# Patient Record
Sex: Female | Born: 1985 | Race: Asian | Hispanic: No | Marital: Married | Smoking: Never smoker
Health system: Southern US, Community
[De-identification: ages and names within clinical notes are randomized; demographics above are authoritative.]

## PROBLEM LIST (undated history)

## (undated) DIAGNOSIS — A159 Respiratory tuberculosis unspecified: Secondary | ICD-10-CM

## (undated) HISTORY — PX: TUBAL LIGATION: SHX77

---

## 2017-10-11 ENCOUNTER — Other Ambulatory Visit: Payer: Self-pay | Admitting: Nurse Practitioner

## 2017-10-11 ENCOUNTER — Other Ambulatory Visit (HOSPITAL_COMMUNITY)
Admission: RE | Admit: 2017-10-11 | Discharge: 2017-10-11 | Disposition: A | Discharge status: Home / Self Care | Payer: Commercial Managed Care - PPO | Source: Ambulatory Visit | Attending: Nurse Practitioner | Admitting: Nurse Practitioner

## 2017-10-11 DIAGNOSIS — Z01419 Encounter for gynecological examination (general) (routine) without abnormal findings: Secondary | ICD-10-CM | POA: Insufficient documentation

## 2017-10-12 LAB — CYTOLOGY - PAP
Diagnosis: NEGATIVE
HPV: NOT DETECTED

## 2018-03-28 ENCOUNTER — Emergency Department (HOSPITAL_BASED_OUTPATIENT_CLINIC_OR_DEPARTMENT_OTHER)
Admission: EM | Admit: 2018-03-28 | Discharge: 2018-03-28 | Disposition: A | Discharge status: Home / Self Care | Payer: Commercial Managed Care - PPO | Attending: Emergency Medicine | Admitting: Emergency Medicine

## 2018-03-28 ENCOUNTER — Emergency Department (HOSPITAL_BASED_OUTPATIENT_CLINIC_OR_DEPARTMENT_OTHER): Payer: Commercial Managed Care - PPO

## 2018-03-28 ENCOUNTER — Encounter (HOSPITAL_BASED_OUTPATIENT_CLINIC_OR_DEPARTMENT_OTHER): Payer: Self-pay | Admitting: Emergency Medicine

## 2018-03-28 ENCOUNTER — Other Ambulatory Visit: Payer: Self-pay

## 2018-03-28 DIAGNOSIS — N838 Other noninflammatory disorders of ovary, fallopian tube and broad ligament: Secondary | ICD-10-CM

## 2018-03-28 DIAGNOSIS — R1032 Left lower quadrant pain: Secondary | ICD-10-CM | POA: Diagnosis present

## 2018-03-28 DIAGNOSIS — Z3202 Encounter for pregnancy test, result negative: Secondary | ICD-10-CM | POA: Insufficient documentation

## 2018-03-28 DIAGNOSIS — N839 Noninflammatory disorder of ovary, fallopian tube and broad ligament, unspecified: Secondary | ICD-10-CM | POA: Diagnosis not present

## 2018-03-28 LAB — COMPREHENSIVE METABOLIC PANEL
ALBUMIN: 3.9 g/dL (ref 3.5–5.0)
ALK PHOS: 66 U/L (ref 38–126)
ALT: 14 U/L (ref 0–44)
ANION GAP: 7 (ref 5–15)
AST: 18 U/L (ref 15–41)
BUN: 6 mg/dL (ref 6–20)
CALCIUM: 8.8 mg/dL — AB (ref 8.9–10.3)
CO2: 25 mmol/L (ref 22–32)
Chloride: 106 mmol/L (ref 98–111)
Creatinine, Ser: 0.47 mg/dL (ref 0.44–1.00)
GFR calc non Af Amer: >60 mL/min (ref >60)
GLUCOSE: 151 mg/dL — AB (ref 70–99)
Potassium: 3.5 mmol/L (ref 3.5–5.1)
SODIUM: 138 mmol/L (ref 135–145)
TOTAL PROTEIN: 7.2 g/dL (ref 6.5–8.1)
Total Bilirubin: 0.5 mg/dL (ref 0.3–1.2)

## 2018-03-28 LAB — PREGNANCY, URINE: Preg Test, Ur: NEGATIVE

## 2018-03-28 IMAGING — CT CT ABD-PELV W/ CM
2 of 4 series · 16 of 46 positions shown, 18 images · IV contrast (APPLIED)
Comparison: None.

CLINICAL DATA: Left lower abdominal pain 2 days.

EXAM:
CT ABDOMEN AND PELVIS WITH CONTRAST
TECHNIQUE: Multidetector CT imaging of the abdomen and pelvis was performed
using the standard protocol following bolus administration of
intravenous contrast.
CONTRAST:  100mL [Z5] IOPAMIDOL ([Z5]) INJECTION 61%

[Series 2: axial st · axial · 0.79mm/px · z∈[-472,-62]mm · 13 of 90 slices shown, 15 images]
[im 4/90  soft-tissue]
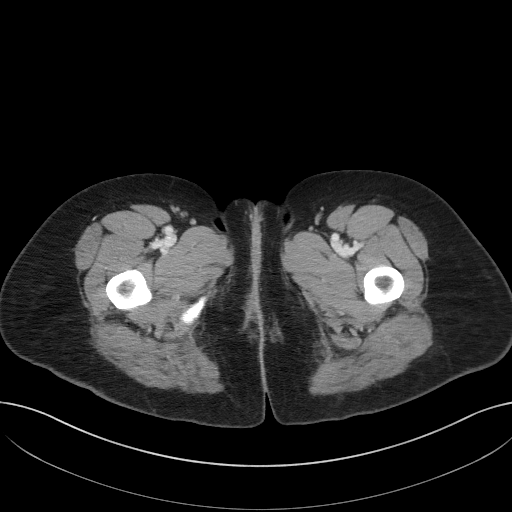
[im 4/90  bone]
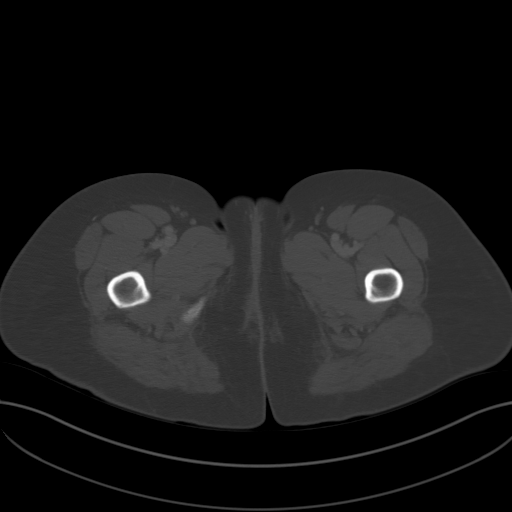
[im 12/90  soft-tissue]
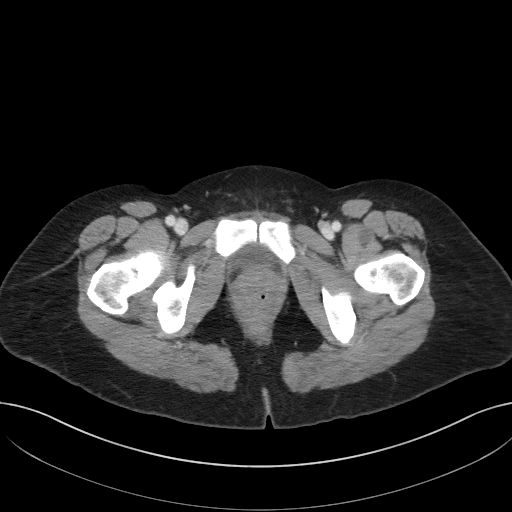
[im 19/90  soft-tissue]
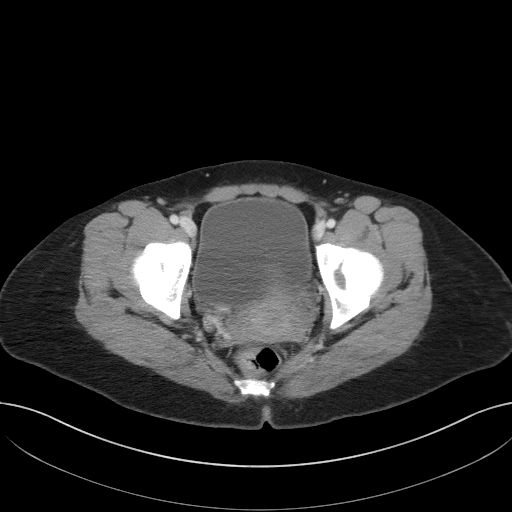
[im 26/90  soft-tissue]
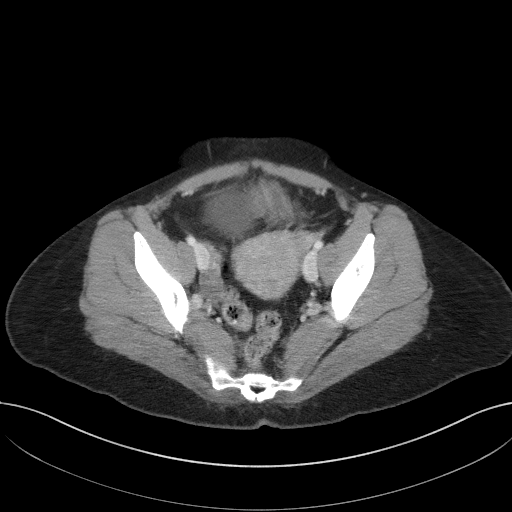
[im 30/90  soft-tissue]
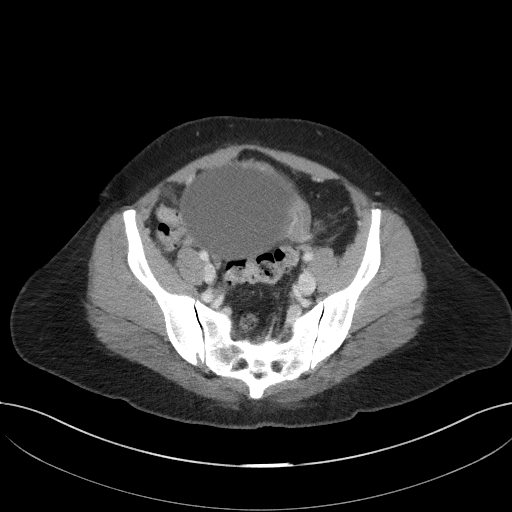
[im 38/90  soft-tissue]
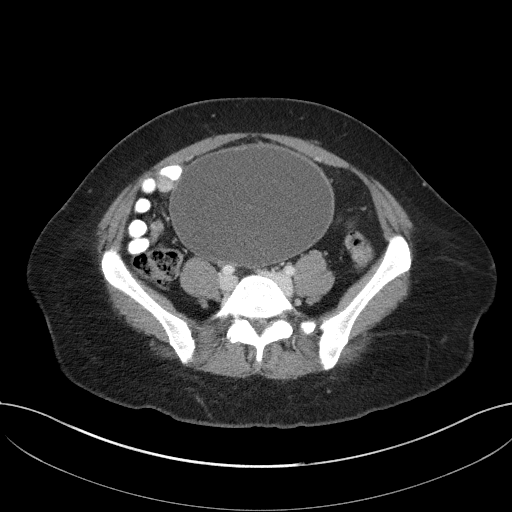
[im 45/90  soft-tissue]
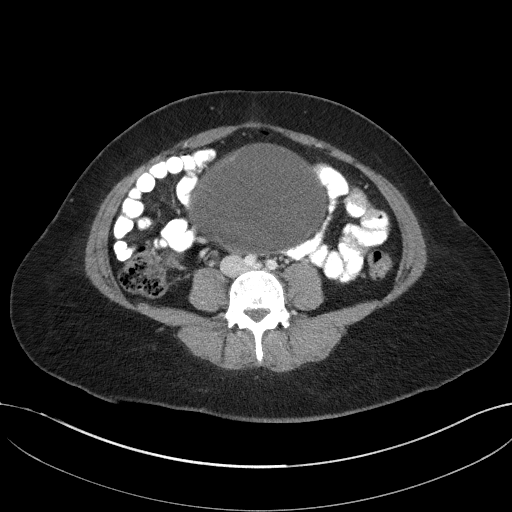
[im 52/90  soft-tissue]
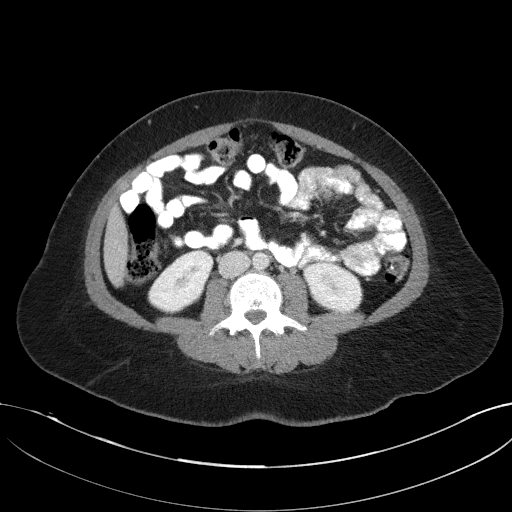
[im 60/90  soft-tissue]
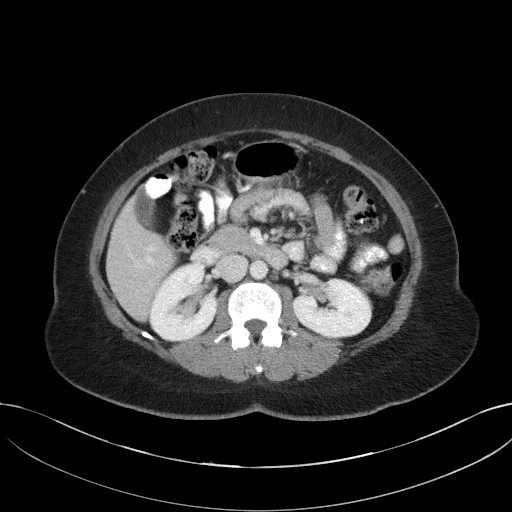
[im 60/90  bone]
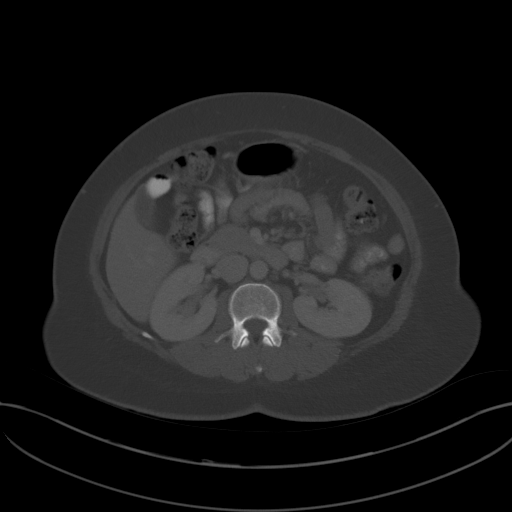
[im 64/90  soft-tissue]
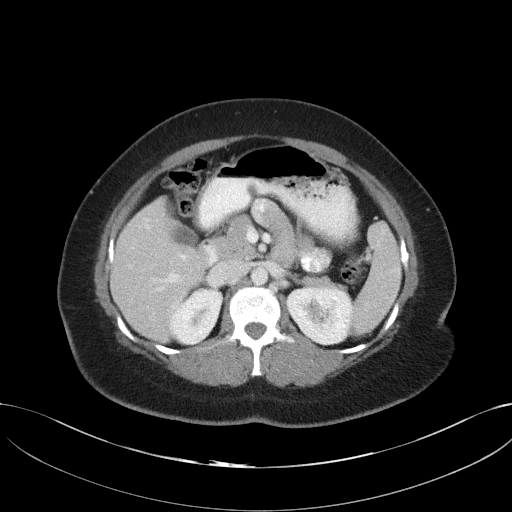
[im 71/90  soft-tissue]
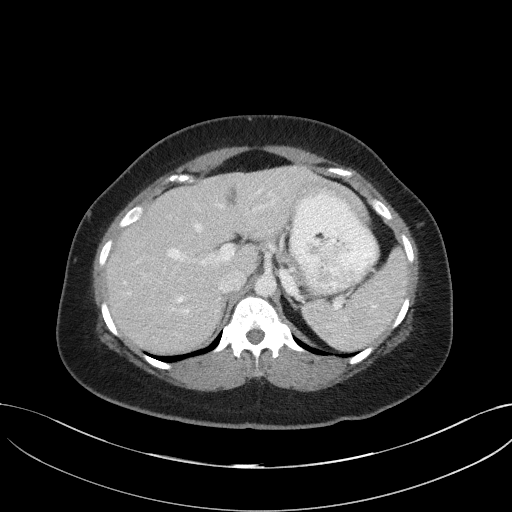
[im 78/90  soft-tissue]
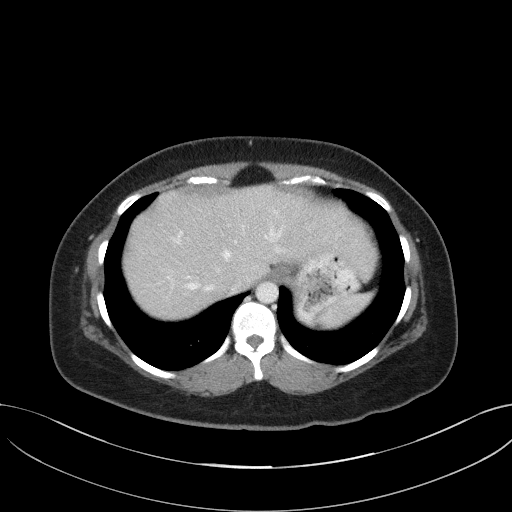
[im 86/90  soft-tissue]
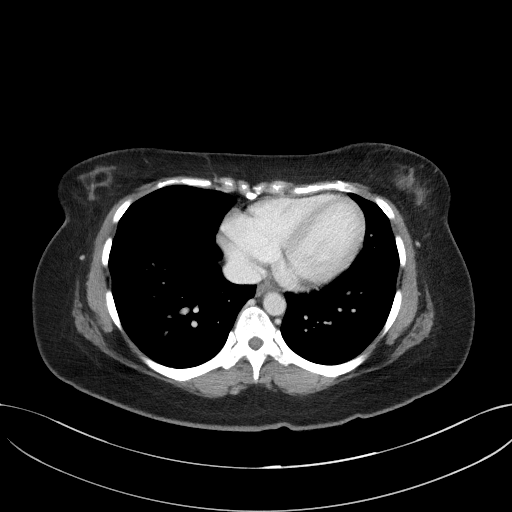

[Series 5: coronal st · coronal · 0.79mm/px · 3 of 87 slices shown]
[im 29/87  soft-tissue]
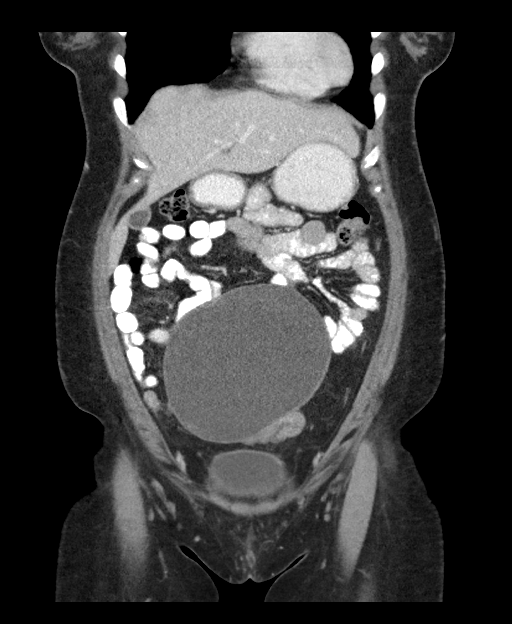
[im 39/87  soft-tissue]
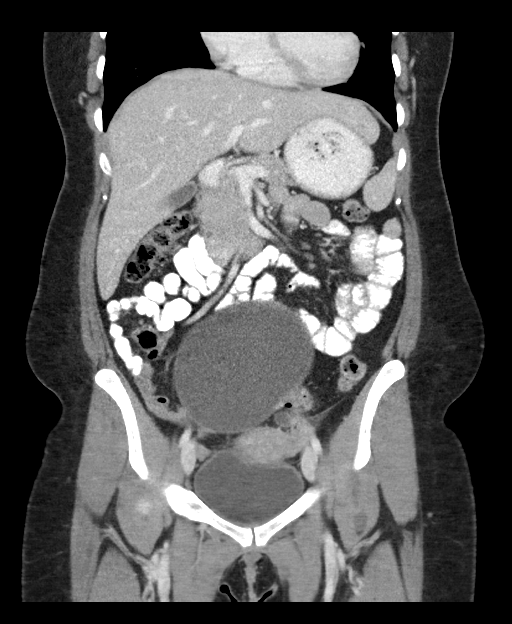
[im 48/87  soft-tissue]
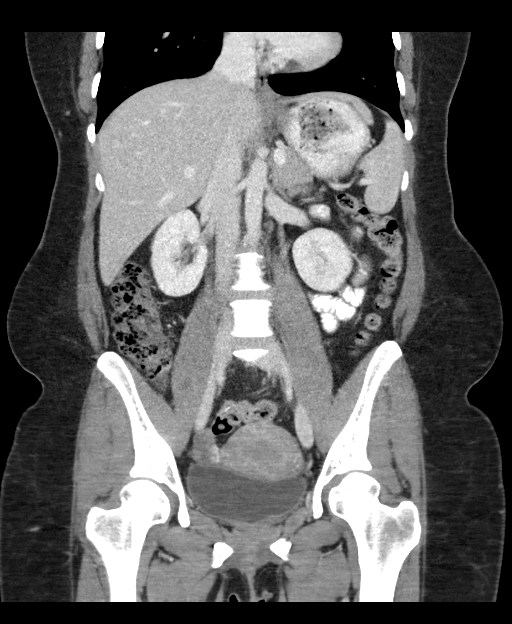

[16 of 46 positions shown; findings below may reference images not displayed]

FINDINGS: Lower chest: Normal.

Hepatobiliary: Normal.

Pancreas: Normal.

Spleen: Normal.

Adrenals/Urinary Tract: Adrenal glands are normal. Kidneys are
normal. Ureters and bladder are unremarkable.

Stomach/Bowel: Stomach and small bowel are normal. Appendix is
normal. Colon is normal.

Vascular/Lymphatic: Vascular structures are within normal. No
adenopathy.

Reproductive: Uterus and right ovary are normal. Large unilocular
cystic left ovarian mass extends superiorly into the midline lower
abdomen just below the umbilicus and measures 9.6 x 13 x 12.1 cm in
AP, transverse and craniocaudal dimensions. No significant solid
component identified.

Other: Small amount of free fluid in the pelvis.

Musculoskeletal: Within normal.
IMPRESSION: No acute findings in the abdomen/pelvis.

Large unilocular cystic left ovarian mass measuring 9.6 x 13 x
cm. This may represent a benign epithelial tumor such as a serous
cystadenoma. Consider GYN consultation possible surgical excision.
MRI may be helpful prior to surgical excision.

## 2018-03-28 MED ORDER — SODIUM CHLORIDE 0.9 % IV SOLN
INTRAVENOUS | Status: DC
  Administered 2018-03-28: 18:00:00 via INTRAVENOUS

## 2018-03-28 MED ORDER — IOPAMIDOL (ISOVUE-300) INJECTION 61%
100.0000 mL | Freq: Once | INTRAVENOUS | Status: AC | PRN
Start: 2018-03-28 — End: 2018-03-28
  Administered 2018-03-28: 100 mL via INTRAVENOUS

## 2018-03-28 MED ORDER — NAPROXEN 375 MG PO TABS: 375.0000 mg | ORAL_TABLET | Freq: Two times a day (BID) | ORAL | 0 refills | Status: DC

## 2018-03-28 NOTE — ED Triage Notes (Signed)
Patient reports left lower abdominal pain since yesterday.  Denies N/V/D, dysuria.

## 2018-03-28 NOTE — ED Provider Notes (Signed)
[facility] Provider Note   CSN: [id number] Arrival date & time: 03/28/18  1738     History   Chief Complaint Chief Complaint  Patient presents with  . Abdominal Pain    HPI Sonia Harris is a 32 y.o. female.  Patient is a 32 year old female with no significant past medical history who is presenting today with 24 hours of intermittent left lower quadrant pain.  Patient states yesterday shortly after eating some plums and drinking some water she developed a severe pain in her left lower quadrant.  She states the pain is colicky.  At its worst is 10 out of 10 but usually does not last more than 10 minutes at a time.  Standing for a prolonged period of time or doing a lot of walking or certain positions seem to make it worse.  She denies any nausea or vomiting.  Eating does not seem to make the pain worse but she was burping quite a bit yesterday.  She denies any urinary or vaginal symptoms.  Patient has had a prior tubal ligation but has been having regular periods the last one was a week and a half ago.  Patient denies fever, cough, congestion.  She has had no other surgeries to her abdomen.  She was seen at the walk-in clinic today and sent here for further care.  At the walk-in clinic she had a UA done which showed trace leukocytes but no white blood cells and a CBC done that showed a leukocytosis of 13,000 with a normal hemoglobin.  The history is provided by the patient and the spouse.    History reviewed. No pertinent past medical history.  There are no active problems to display for this patient.   History reviewed. No pertinent surgical history.   OB History   None      Home Medications    Prior to Admission medications   Not on File    Family History History reviewed. No pertinent family history.  Social History Social History   Tobacco Use  . Smoking status: Never Smoker  . Smokeless tobacco: Never Used  Substance Use Topics  .  Alcohol use: Never    Frequency: Never  . Drug use: Never     Allergies   Patient has no known allergies.   Review of Systems Review of Systems  All other systems reviewed and are negative.    Physical Exam Updated Vital Signs BP 104/74   Pulse 85   Temp 98 F (36.7 C) (Oral)   Resp 16   Ht 5\' 6"  (1.676 m)   Wt 70.3 kg (155 lb)   LMP 03/12/2018   SpO2 99%   BMI 25.02 kg/m   Physical Exam  Constitutional: She is oriented to person, place, and time. She appears well-developed and well-nourished. No distress.  HENT:  Head: Normocephalic and atraumatic.  Mouth/Throat: Oropharynx is clear and moist.  Eyes: Pupils are equal, round, and reactive to light. Conjunctivae and EOM are normal.  Neck: Normal range of motion. Neck supple.  Cardiovascular: Normal rate, regular rhythm and intact distal pulses.  No murmur heard. Pulmonary/Chest: Effort normal and breath sounds normal. No respiratory distress. She has no wheezes. She has no rales.  Abdominal: Soft. She exhibits no distension. There is tenderness in the left lower quadrant. There is guarding. There is no rebound and no CVA tenderness.  Musculoskeletal: Normal range of motion. She exhibits no edema or tenderness.  Neurological: She is alert and  oriented to person, place, and time.  Skin: Skin is warm and dry. No rash noted. No erythema.  Psychiatric: She has a normal mood and affect. Her behavior is normal.  Nursing note and vitals reviewed.    ED Treatments / Results  Labs (all labs ordered are listed, but only abnormal results are displayed) Labs Reviewed  COMPREHENSIVE METABOLIC PANEL - Abnormal; Notable for the following components:      Result Value   Glucose, Bld 151 (*)    Calcium 8.8 (*)    All other components within normal limits  PREGNANCY, URINE    EKG None  Radiology Ct Abdomen Pelvis W Contrast  Result Date: 03/28/2018 CLINICAL DATA:  Left lower abdominal pain 2 days. EXAM: CT ABDOMEN AND  PELVIS WITH CONTRAST TECHNIQUE: Multidetector CT imaging of the abdomen and pelvis was performed using the standard protocol following bolus administration of intravenous contrast. CONTRAST:  170mL ISOVUE-300 IOPAMIDOL (ISOVUE-300) INJECTION 61% COMPARISON:  None. FINDINGS: Lower chest: Normal. Hepatobiliary: Normal. Pancreas: Normal. Spleen: Normal. Adrenals/Urinary Tract: Adrenal glands are normal. Kidneys are normal. Ureters and bladder are unremarkable. Stomach/Bowel: Stomach and small bowel are normal. Appendix is normal. Colon is normal. Vascular/Lymphatic: Vascular structures are within normal. No adenopathy. Reproductive: Uterus and right ovary are normal. Large unilocular cystic left ovarian mass extends superiorly into the midline lower abdomen just below the umbilicus and measures 9.6 x 13 x 12.1 cm in AP, transverse and craniocaudal dimensions. No significant solid component identified. Other: Small amount of free fluid in the pelvis. Musculoskeletal: Within normal. IMPRESSION: No acute findings in the abdomen/pelvis. Large unilocular cystic left ovarian mass measuring 9.6 x 13 x 12.1 cm. This may represent a benign epithelial tumor such as a serous cystadenoma. Consider GYN consultation possible surgical excision. MRI may be helpful prior to surgical excision. Electronically Signed   By: Marin Olp M.D.   On: 03/28/2018 21:05    Procedures Procedures (including critical care time)  Medications Ordered in ED Medications  0.9 %  sodium chloride infusion ( Intravenous New Bag/Given 03/28/18 1816)     Initial Impression / Assessment and Plan / ED Course  I have reviewed the triage vital signs and the nursing notes.  Pertinent labs & imaging results that were available during my care of the patient were reviewed by me and considered in my medical decision making (see chart for details).    Healthy young female with no prior medical history presenting today with colicky left lower quadrant  pain that started suddenly yesterday.  This could be GI in nature and something like diverticulitis however also concern for possible kidney stone.  Lower suspicion for ovarian torsion or ectopic pregnancy.  Patient denies any urinary symptoms or symptoms concerning for urinary tract infection.  She is in a monogamous relationship with her husband and has no vaginal symptoms concerning for PID, TOA or pelvic pathology.  Currently on my exam patient's pain is minimal but she does have guarding in the left lower quadrant.  She has a known leukocytosis of 13,000 done in the walk-in clinic earlier today.  UA with out hemoglobin and only trace leukocytes.  We will do a CT to further evaluate.  Patient's urine pregnancy test is negative and CMP is pending.  9:20 PM CMP within normal limits.  Patient's CT shows a large unilocular cystic left ovarian mass measuring 9 x 13 x 12.  This may represent a benign epithelial tumor.  Discussed with Dr. Rosana Hoes our OB/GYN who at this  time recommended follow-up as an outpatient as patient is not currently uncomfortable or having pain concerning for torsion.  Final Clinical Impressions(s) / ED Diagnoses   Final diagnoses:  Ovarian mass, left    ED Discharge Orders    None       Blanchie Dessert, MD 03/28/18 2121

## 2018-03-28 NOTE — ED Notes (Signed)
Patient transported to CT 

## 2018-03-28 NOTE — ED Notes (Signed)
Pt given oral contrast.

## 2018-03-28 NOTE — ED Notes (Signed)
Patient completed both bottles of PO contrast; denies pain or NV; nad noted.

## 2018-03-28 NOTE — Discharge Instructions (Signed)
You need to return to [facility] hospital for severe pain that does not go away.  Call your doctor in the morning for appointment in the next few days.  Tell them you were seen the ER and found an ovarian mass that needs further workup.

## 2018-04-09 ENCOUNTER — Encounter (HOSPITAL_COMMUNITY): Payer: Self-pay | Admitting: *Deleted

## 2018-04-10 ENCOUNTER — Encounter (HOSPITAL_COMMUNITY): Payer: Self-pay | Admitting: Certified Registered Nurse Anesthetist

## 2018-04-10 ENCOUNTER — Other Ambulatory Visit: Payer: Self-pay

## 2018-04-10 ENCOUNTER — Ambulatory Visit (HOSPITAL_COMMUNITY): Payer: Commercial Managed Care - PPO | Admitting: Certified Registered Nurse Anesthetist

## 2018-04-10 ENCOUNTER — Ambulatory Visit (HOSPITAL_COMMUNITY)
Admission: RE | Admit: 2018-04-10 | Discharge: 2018-04-10 | Disposition: A | Discharge status: Home / Self Care | Payer: Commercial Managed Care - PPO | Source: Ambulatory Visit | Attending: Obstetrics and Gynecology | Admitting: Obstetrics and Gynecology

## 2018-04-10 ENCOUNTER — Encounter (HOSPITAL_COMMUNITY)
Admission: RE | Disposition: A | Discharge status: Home / Self Care | Payer: Self-pay | Source: Ambulatory Visit | Attending: Obstetrics and Gynecology

## 2018-04-10 DIAGNOSIS — Z8349 Family history of other endocrine, nutritional and metabolic diseases: Secondary | ICD-10-CM | POA: Diagnosis not present

## 2018-04-10 DIAGNOSIS — K660 Peritoneal adhesions (postprocedural) (postinfection): Secondary | ICD-10-CM | POA: Diagnosis not present

## 2018-04-10 DIAGNOSIS — D271 Benign neoplasm of left ovary: Secondary | ICD-10-CM | POA: Diagnosis not present

## 2018-04-10 DIAGNOSIS — Z833 Family history of diabetes mellitus: Secondary | ICD-10-CM | POA: Diagnosis not present

## 2018-04-10 DIAGNOSIS — Z8249 Family history of ischemic heart disease and other diseases of the circulatory system: Secondary | ICD-10-CM | POA: Insufficient documentation

## 2018-04-10 DIAGNOSIS — N83202 Unspecified ovarian cyst, left side: Secondary | ICD-10-CM | POA: Diagnosis present

## 2018-04-10 HISTORY — PX: LAPAROSCOPIC LYSIS OF ADHESIONS: SHX5905

## 2018-04-10 HISTORY — DX: Respiratory tuberculosis unspecified: A15.9

## 2018-04-10 LAB — TYPE AND SCREEN
ABO/RH(D): O POS
Antibody Screen: NEGATIVE

## 2018-04-10 LAB — CBC
HCT: 37.4 % (ref 36.0–46.0)
Hemoglobin: 12.3 g/dL (ref 12.0–15.0)
MCH: 27.7 pg (ref 26.0–34.0)
MCHC: 32.9 g/dL (ref 30.0–36.0)
MCV: 84.2 fL (ref 78.0–100.0)
PLATELETS: 280 10*3/uL (ref 150–400)
RBC: 4.44 MIL/uL (ref 3.87–5.11)
RDW: 13.6 % (ref 11.5–15.5)
WBC: 8.8 10*3/uL (ref 4.0–10.5)

## 2018-04-10 LAB — PREGNANCY, URINE: Preg Test, Ur: NEGATIVE

## 2018-04-10 LAB — ABO/RH: ABO/RH(D): O POS

## 2018-04-10 SURGERY — OOPHORECTOMY, LAPAROSCOPIC
Anesthesia: General | Laterality: Left

## 2018-04-10 MED ORDER — LIDOCAINE HCL (PF) 1 % IJ SOLN
INTRAMUSCULAR | Status: AC
  Filled 2018-04-10: qty 5

## 2018-04-10 MED ORDER — SCOPOLAMINE 1 MG/3DAYS TD PT72
1.0000 | MEDICATED_PATCH | Freq: Once | TRANSDERMAL | Status: DC
  Administered 2018-04-10: 1.5 mg via TRANSDERMAL

## 2018-04-10 MED ORDER — FENTANYL CITRATE (PF) 100 MCG/2ML IJ SOLN
INTRAMUSCULAR | Status: DC
Start: 2018-04-10 — End: 2018-04-10
  Filled 2018-04-10: qty 2

## 2018-04-10 MED ORDER — KETOROLAC TROMETHAMINE 30 MG/ML IJ SOLN
INTRAMUSCULAR | Status: DC | PRN
  Administered 2018-04-10: 30 mg via INTRAVENOUS

## 2018-04-10 MED ORDER — DEXAMETHASONE SODIUM PHOSPHATE 10 MG/ML IJ SOLN
INTRAMUSCULAR | Status: DC | PRN
  Administered 2018-04-10: 4 mg via INTRAVENOUS

## 2018-04-10 MED ORDER — PROPOFOL 10 MG/ML IV BOLUS
INTRAVENOUS | Status: DC | PRN
  Administered 2018-04-10: 150 mg via INTRAVENOUS

## 2018-04-10 MED ORDER — CEFAZOLIN SODIUM-DEXTROSE 2-4 GM/100ML-% IV SOLN
2.0000 g | INTRAVENOUS | Status: AC
  Administered 2018-04-10: 2 g via INTRAVENOUS

## 2018-04-10 MED ORDER — LIDOCAINE HCL (CARDIAC) PF 100 MG/5ML IV SOSY
PREFILLED_SYRINGE | INTRAVENOUS | Status: DC | PRN
  Administered 2018-04-10: 50 mg via INTRAVENOUS

## 2018-04-10 MED ORDER — EPHEDRINE 5 MG/ML INJ
INTRAVENOUS | Status: AC
  Filled 2018-04-10: qty 10

## 2018-04-10 MED ORDER — PROMETHAZINE HCL 25 MG/ML IJ SOLN: 6.2500 mg | INTRAMUSCULAR | Status: DC | PRN

## 2018-04-10 MED ORDER — FENTANYL CITRATE (PF) 250 MCG/5ML IJ SOLN
INTRAMUSCULAR | Status: AC
  Filled 2018-04-10: qty 5

## 2018-04-10 MED ORDER — FENTANYL CITRATE (PF) 100 MCG/2ML IJ SOLN
INTRAMUSCULAR | Status: DC | PRN
  Administered 2018-04-10 (×4): 50 ug via INTRAVENOUS

## 2018-04-10 MED ORDER — MIDAZOLAM HCL 2 MG/2ML IJ SOLN
INTRAMUSCULAR | Status: DC | PRN
  Administered 2018-04-10 (×2): 1 mg via INTRAVENOUS

## 2018-04-10 MED ORDER — FENTANYL CITRATE (PF) 100 MCG/2ML IJ SOLN
25.0000 ug | INTRAMUSCULAR | Status: DC | PRN
  Administered 2018-04-10: 50 ug via INTRAVENOUS

## 2018-04-10 MED ORDER — PROPOFOL 10 MG/ML IV BOLUS
INTRAVENOUS | Status: AC
  Filled 2018-04-10: qty 20

## 2018-04-10 MED ORDER — ONDANSETRON HCL 4 MG/2ML IJ SOLN
INTRAMUSCULAR | Status: AC
  Filled 2018-04-10: qty 2

## 2018-04-10 MED ORDER — SUGAMMADEX SODIUM 200 MG/2ML IV SOLN
INTRAVENOUS | Status: AC
  Filled 2018-04-10: qty 2

## 2018-04-10 MED ORDER — IBUPROFEN 600 MG PO TABS: 600.0000 mg | ORAL_TABLET | Freq: Four times a day (QID) | ORAL | 1 refills | Status: AC | PRN

## 2018-04-10 MED ORDER — ROCURONIUM BROMIDE 100 MG/10ML IV SOLN
INTRAVENOUS | Status: DC | PRN
  Administered 2018-04-10: 10 mg via INTRAVENOUS
  Administered 2018-04-10: 40 mg via INTRAVENOUS

## 2018-04-10 MED ORDER — SUGAMMADEX SODIUM 200 MG/2ML IV SOLN
INTRAVENOUS | Status: DC | PRN
  Administered 2018-04-10: 150 mg via INTRAVENOUS

## 2018-04-10 MED ORDER — BUPIVACAINE HCL (PF) 0.25 % IJ SOLN
INTRAMUSCULAR | Status: DC | PRN
  Administered 2018-04-10: 30 mL

## 2018-04-10 MED ORDER — ONDANSETRON HCL 4 MG/2ML IJ SOLN
INTRAMUSCULAR | Status: DC | PRN
  Administered 2018-04-10: 4 mg via INTRAVENOUS

## 2018-04-10 MED ORDER — EPHEDRINE SULFATE 50 MG/ML IJ SOLN
INTRAMUSCULAR | Status: DC | PRN
  Administered 2018-04-10: 10 mg via INTRAVENOUS

## 2018-04-10 MED ORDER — KETOROLAC TROMETHAMINE 30 MG/ML IJ SOLN
INTRAMUSCULAR | Status: AC
  Filled 2018-04-10: qty 1

## 2018-04-10 MED ORDER — DEXAMETHASONE SODIUM PHOSPHATE 4 MG/ML IJ SOLN
INTRAMUSCULAR | Status: AC
  Filled 2018-04-10: qty 1

## 2018-04-10 MED ORDER — OXYCODONE-ACETAMINOPHEN 5-325 MG PO TABS: 1.0000 | ORAL_TABLET | ORAL | 0 refills | Status: AC | PRN

## 2018-04-10 MED ORDER — SCOPOLAMINE 1 MG/3DAYS TD PT72
MEDICATED_PATCH | TRANSDERMAL | Status: AC
  Filled 2018-04-10: qty 1

## 2018-04-10 MED ORDER — CEFAZOLIN SODIUM-DEXTROSE 2-4 GM/100ML-% IV SOLN
INTRAVENOUS | Status: AC
  Filled 2018-04-10: qty 100

## 2018-04-10 MED ORDER — LACTATED RINGERS IV SOLN
INTRAVENOUS | Status: DC
  Administered 2018-04-10: 14:00:00 via INTRAVENOUS
  Administered 2018-04-10: 1000 mL via INTRAVENOUS

## 2018-04-10 MED ORDER — MIDAZOLAM HCL 2 MG/2ML IJ SOLN
INTRAMUSCULAR | Status: AC
  Filled 2018-04-10: qty 2

## 2018-04-10 SURGICAL SUPPLY — 33 items
APPLICATOR ARISTA FLEXITIP XL (MISCELLANEOUS) ×4 IMPLANT
DERMABOND ADHESIVE PROPEN (GAUZE/BANDAGES/DRESSINGS) ×2
DERMABOND ADVANCED (GAUZE/BANDAGES/DRESSINGS) ×2
DERMABOND ADVANCED .7 DNX12 (GAUZE/BANDAGES/DRESSINGS) ×2 IMPLANT
DERMABOND ADVANCED .7 DNX6 (GAUZE/BANDAGES/DRESSINGS) ×2 IMPLANT
DILATOR CANAL MILEX (MISCELLANEOUS) ×4 IMPLANT
DRSG OPSITE POSTOP 3X4 (GAUZE/BANDAGES/DRESSINGS) ×4 IMPLANT
DURAPREP 26ML APPLICATOR (WOUND CARE) ×4 IMPLANT
FILTER SMOKE EVAC LAPAROSHD (FILTER) ×4 IMPLANT
GLOVE BIO SURGEON STRL SZ7 (GLOVE) ×4 IMPLANT
GLOVE BIOGEL PI IND STRL 7.0 (GLOVE) ×8 IMPLANT
GLOVE BIOGEL PI INDICATOR 7.0 (GLOVE) ×8
GOWN STRL REUS W/TWL LRG LVL3 (GOWN DISPOSABLE) ×8 IMPLANT
HEMOSTAT ARISTA ABSORB 3G PWDR (MISCELLANEOUS) ×4 IMPLANT
NEEDLE SPNL 18GX3.5 QUINCKE PK (NEEDLE) ×4 IMPLANT
PACK LAPAROSCOPY BASIN (CUSTOM PROCEDURE TRAY) ×4 IMPLANT
PACK TRENDGUARD 450 HYBRID PRO (MISCELLANEOUS) ×2 IMPLANT
POUCH ENDO CATCH II 15MM (MISCELLANEOUS) ×4 IMPLANT
POUCH SPECIMEN RETRIEVAL 10MM (ENDOMECHANICALS) IMPLANT
PROTECTOR NERVE ULNAR (MISCELLANEOUS) ×8 IMPLANT
SET IRRIG TUBING LAPAROSCOPIC (IRRIGATION / IRRIGATOR) ×4 IMPLANT
SHEARS HARMONIC ACE PLUS 36CM (ENDOMECHANICALS) ×4 IMPLANT
SLEEVE XCEL OPT CAN 5 100 (ENDOMECHANICALS) ×4 IMPLANT
SUT MNCRL AB 4-0 PS2 18 (SUTURE) ×4 IMPLANT
SUT VICRYL 0 UR6 27IN ABS (SUTURE) ×4 IMPLANT
SYRINGE 60CC LL (MISCELLANEOUS) ×4 IMPLANT
TOWEL OR 17X24 6PK STRL BLUE (TOWEL DISPOSABLE) ×8 IMPLANT
TRAY FOLEY W/BAG SLVR 14FR (SET/KITS/TRAYS/PACK) ×4 IMPLANT
TRENDGUARD 450 HYBRID PRO PACK (MISCELLANEOUS) ×4
TROCAR BLADELESS 15MM (ENDOMECHANICALS) ×4 IMPLANT
TROCAR XCEL NON-BLD 11X100MML (ENDOMECHANICALS) ×4 IMPLANT
TROCAR XCEL NON-BLD 5MMX100MML (ENDOMECHANICALS) ×4 IMPLANT
TUBING INSUF HEATED (TUBING) ×4 IMPLANT

## 2018-04-10 NOTE — Transfer of Care (Signed)
Immediate Anesthesia Transfer of Care Note  Patient: Sonia Harris  Procedure(s) Performed: LAPAROSCOPIC OOPHORECTOMY (Left ) LAPAROSCOPIC LYSIS OF ADHESIONS  Patient Location: PACU  Anesthesia Type:General  Level of Consciousness: awake, alert  and oriented  Airway & Oxygen Therapy: Patient Spontanous Breathing and Patient connected to nasal cannula oxygen  Post-op Assessment: Report given to RN and Post -op Vital signs reviewed and stable  Post vital signs: Reviewed and stable  Last Vitals:  Vitals Value Taken Time  BP 107/53 04/10/2018  2:57 PM  Temp    Pulse 81 04/10/2018  2:58 PM  Resp 13 04/10/2018  2:58 PM  SpO2 94 % 04/10/2018  2:58 PM  Vitals shown include unvalidated device data.  Last Pain:  Vitals:   04/10/18 1228  TempSrc: Oral      Patients Stated Pain Goal: 3 (08/03/10 1735)  Complications: No apparent anesthesia complications

## 2018-04-10 NOTE — Interval H&P Note (Signed)
History and Physical Interval Note:  04/10/2018 12:46 PM  Sonia Harris  has presented today for surgery, with the diagnosis of N83.9 Ovarian mass  The various methods of treatment have been discussed with the patient and family. After consideration of risks, benefits and other options for treatment, the patient has consented to  Procedure(s) with comments: LAPAROSCOPIC OOPHORECTOMY (Left) - pull the 15 trocar and the 15 bag as a surgical intervention .  The patient's history has been reviewed, patient examined, no change in status, stable for surgery.  I have reviewed the patient's chart and labs.  Questions were answered to the patient's satisfaction.     [name]

## 2018-04-10 NOTE — Anesthesia Procedure Notes (Signed)
Procedure Name: Intubation Date/Time: 04/10/2018 1:13 PM Performed by: Bufford Spikes, CRNA Pre-anesthesia Checklist: Patient identified, Emergency Drugs available, Suction available and Patient being monitored Patient Re-evaluated:Patient Re-evaluated prior to induction Oxygen Delivery Method: Circle system utilized Preoxygenation: Pre-oxygenation with 100% oxygen Induction Type: IV induction Ventilation: Mask ventilation without difficulty Laryngoscope Size: Miller and 2 Grade View: Grade I Tube type: Oral Tube size: 7.0 mm Number of attempts: 1 Airway Equipment and Method: Stylet and Oral airway Placement Confirmation: ETT inserted through vocal cords under direct vision,  positive ETCO2 and breath sounds checked- equal and bilateral Secured at: 20 cm Tube secured with: Tape Dental Injury: Teeth and Oropharynx as per pre-operative assessment

## 2018-04-10 NOTE — Anesthesia Preprocedure Evaluation (Signed)
Anesthesia Evaluation  Patient identified by MRN, date of birth, ID band Patient awake    Reviewed: Allergy & Precautions, NPO status , Patient's Chart, lab work & pertinent test results  Airway Mallampati: II  TM Distance: >3 FB Neck ROM: Full    Dental  (+) Dental Advisory Given   Pulmonary neg pulmonary ROS,    breath sounds clear to auscultation       Cardiovascular negative cardio ROS   Rhythm:Regular Rate:Normal     Neuro/Psych negative neurological ROS     GI/Hepatic negative GI ROS, Neg liver ROS,   Endo/Other  negative endocrine ROS  Renal/GU negative Renal ROS     Musculoskeletal   Abdominal   Peds  Hematology negative hematology ROS (+)   Anesthesia Other Findings   Reproductive/Obstetrics                            No results found for: WBC, HGB, HCT, MCV, PLT Lab Results  Component Value Date   CREATININE 0.47 03/28/2018   BUN 6 03/28/2018   NA 138 03/28/2018   K 3.5 03/28/2018   CL 106 03/28/2018   CO2 25 03/28/2018    Anesthesia Physical Anesthesia Plan  ASA: I  Anesthesia Plan: General   Post-op Pain Management:    Induction: Intravenous  PONV Risk Score and Plan: 4 or greater and Scopolamine patch - Pre-op, Midazolam, Dexamethasone, Ondansetron and Treatment may vary due to age or medical condition  Airway Management Planned: Oral ETT  Additional Equipment:   Intra-op Plan:   Post-operative Plan: Extubation in OR  Informed Consent: I have reviewed the patients History and Physical, chart, labs and discussed the procedure including the risks, benefits and alternatives for the proposed anesthesia with the patient or authorized representative who has indicated his/her understanding and acceptance.   Dental advisory given  Plan Discussed with: CRNA  Anesthesia Plan Comments:         Anesthesia Quick Evaluation

## 2018-04-10 NOTE — Discharge Instructions (Addendum)
DISCHARGE INSTRUCTIONS: Laparoscopy ° °The following instructions have been prepared to help you care for yourself upon your return home today. ° °Wound care: °• Do not get the incision wet for the first 24 hours. The incision should be kept clean and dry. °• The Band-Aids or dressings may be removed the day after surgery. °• Should the incision become sore, red, and swollen after the first week, check with your doctor. ° °Personal hygiene: °• Shower the day after your procedure. ° °Activity and limitations: °• Do NOT drive or operate any equipment today. °• Do NOT lift anything more than 15 pounds for 2-3 weeks after surgery. °• Do NOT rest in bed all day. °• Walking is encouraged. Walk each day, starting slowly with 5-minute walks 3 or 4 times a day. Slowly increase the length of your walks. °• Walk up and down stairs slowly. °• Do NOT do strenuous activities, such as golfing, playing tennis, bowling, running, biking, weight lifting, gardening, mowing, or vacuuming for 2-4 weeks. Ask your doctor when it is okay to start. ° °Diet: Eat a light meal as desired this evening. You may resume your usual diet tomorrow. ° °Return to work: This is dependent on the type of work you do. For the most part you can return to a desk job within a week of surgery. If you are more active at work, please discuss this with your doctor. ° °What to expect after your surgery: You may have a slight burning sensation when you urinate on the first day. You may have a very small amount of blood in the urine. Expect to have a small amount of vaginal discharge/light bleeding for 1-2 weeks. It is not unusual to have abdominal soreness and bruising for up to 2 weeks. You may be tired and need more rest for about 1 week. You may experience shoulder pain for 24-72 hours. Lying flat in bed may relieve it. ° °Call your doctor for any of the following: °• Develop a fever of 100.4 or greater °• Inability to urinate 6 hours after discharge from  hospital °• Severe pain not relieved by pain medications °• Persistent of heavy bleeding at incision site °• Redness or swelling around incision site after a week °• Increasing nausea or vomiting ° °Patient Signature________________________________________ °Nurse Signature_________________________________________DISCHARGE INSTRUCTIONS: Laparoscopy ° °The following instructions have been prepared to help you care for yourself upon your return home today. ° °Wound care: °• Do not get the incision wet for the first 24 hours. The incision should be kept clean and dry. °• The Band-Aids or dressings may be removed the day after surgery. °• Should the incision become sore, red, and swollen after the first week, check with your doctor. ° °Personal hygiene: °• Shower the day after your procedure. ° °Activity and limitations: °• Do NOT drive or operate any equipment today. °• Do NOT lift anything more than 15 pounds for 2-3 weeks after surgery. °• Do NOT rest in bed all day. °• Walking is encouraged. Walk each day, starting slowly with 5-minute walks 3 or 4 times a day. Slowly increase the length of your walks. °• Walk up and down stairs slowly. °• Do NOT do strenuous activities, such as golfing, playing tennis, bowling, running, biking, weight lifting, gardening, mowing, or vacuuming for 2-4 weeks. Ask your doctor when it is okay to start. ° °Diet: Eat a light meal as desired this evening. You may resume your usual diet tomorrow. ° °Return to work: This is dependent on   the type of work you do. For the most part you can return to a desk job within a week of surgery. If you are more active at work, please discuss this with your doctor.  What to expect after your surgery: You may have a slight burning sensation when you urinate on the first day. You may have a very small amount of blood in the urine. Expect to have a small amount of vaginal discharge/light bleeding for 1-2 weeks. It is not unusual to have abdominal soreness  and bruising for up to 2 weeks. You may be tired and need more rest for about 1 week. You may experience shoulder pain for 24-72 hours. Lying flat in bed may relieve it.  Call your doctor for any of the following:  Develop a fever of 100.4 or greater  Inability to urinate 6 hours after discharge from hospital  Severe pain not relieved by pain medications  Persistent of heavy bleeding at incision site  Redness or swelling around incision site after a week  Increasing nausea or vomiting  Patient Signature________________________________________ Nurse Signature_________________________________________ Post Anesthesia Home Care Instructions  Activity: Get plenty of rest for the remainder of the day. A responsible individual must stay with you for 24 hours following the procedure.  For the next 24 hours, DO NOT: -Drive a car -Paediatric nurse -Drink alcoholic beverages -Take any medication unless instructed by your physician -Make any legal decisions or sign important papers.  Meals: Start with liquid foods such as gelatin or soup. Progress to regular foods as tolerated. Avoid greasy, spicy, heavy foods. If nausea and/or vomiting occur, drink only clear liquids until the nausea and/or vomiting subsides. Call your physician if vomiting continues.  Special Instructions/Symptoms: Your throat may feel dry or sore from the anesthesia or the breathing tube placed in your throat during surgery. If this causes discomfort, gargle with warm salt water. The discomfort should disappear within 24 hours.  If you had a scopolamine patch placed behind your ear for the management of post- operative nausea and/or vomiting:  1. The medication in the patch is effective for 72 hours, after which it should be removed.  Wrap patch in a tissue and discard in the trash. Wash hands thoroughly with soap and water. 2. You may remove the patch earlier than 72 hours if you experience unpleasant side effects  which may include dry mouth, dizziness or visual disturbances. 3. Avoid touching the patch. Wash your hands with soap and water after contact with the patch.   Diagnostic Laparoscopy, Care After Refer to this sheet in the next few weeks. These instructions provide you with information about caring for yourself after your procedure. Your health care provider may also give you more specific instructions. Your treatment has been planned according to current medical practices, but problems sometimes occur. Call your health care provider if you have any problems or questions after your procedure. What can I expect after the procedure? After your procedure, it is common to have mild discomfort in the throat and abdomen. Follow these instructions at home:  Take over-the-counter and prescription medicines only as told by your health care provider.  Do not drive for 24 hours if you received a sedative.  Return to your normal activities as told by your health care provider.  Do not take baths, swim, or use a hot tub until your health care provider approves. You may shower.  Follow instructions from your health care provider about how to take care of your incision.  Make sure you: ? Wash your hands with soap and water before you change your bandage (dressing). If soap and water are not available, use hand sanitizer. ? Change your dressing as told by your health care provider. ? Leave stitches (sutures), skin glue, or adhesive strips in place. These skin closures may need to stay in place for 2 weeks or longer. If adhesive strip edges start to loosen and curl up, you may trim the loose edges. Do not remove adhesive strips completely unless your health care provider tells you to do that.  Check your incision area every day for signs of infection. Check for: ? More redness, swelling, or pain. ? More fluid or blood. ? Warmth. ? Pus or a bad smell.  It is your responsibility to get the results of your  procedure. Ask your health care provider or the department performing the procedure when your results will be ready. Contact a health care provider if:  There is new pain in your shoulders.  You feel light-headed or faint.  You are unable to pass gas or unable to have a bowel movement.  You feel nauseous or you vomit.  You develop a rash.  You have more redness, swelling, or pain around your incision.  You have more fluid or blood coming from your incision.  Your incision feels warm to the touch.  You have pus or a bad smell coming from your incision.  You have a fever or chills. Get help right away if:  Your pain is getting worse.  You have ongoing vomiting.  The edges of your incision open up.  You have trouble breathing.  You have chest pain. This information is not intended to replace advice given to you by your health care provider. Make sure you discuss any questions you have with your health care provider. Document Released: 08/30/2015 Document Revised: 02/24/2016 Document Reviewed: 06/01/2015 Elsevier Interactive Patient Education  2018 [employer].

## 2018-04-10 NOTE — Brief Op Note (Signed)
04/10/2018  2:49 PM  PATIENT:  Sonia Harris  32 y.o. female  PRE-OPERATIVE DIAGNOSIS:  N83.9 Ovarian mass, left  POST-OPERATIVE DIAGNOSIS:  Same, omental adhesions  PROCEDURE:  Procedure(s): LAPAROSCOPIC OOPHORECTOMY (Left) LAPAROSCOPIC LYSIS OF ADHESIONS  SURGEON:  Surgeon(s) and Role:    [name], MD - Primary    * Janyth Pupa, DO - Assisting  PHYSICIAN ASSISTANT:   ASSISTANTS: none   ANESTHESIA:   local and general  EBL:  50 mL   BLOOD ADMINISTERED:none  DRAINS: Urinary Catheter (Foley)   LOCAL MEDICATIONS USED:  MARCAINE    and Amount: Less than 30 ml  SPECIMEN:  Source of Specimen:  Pelvic washings, ovarian cyst fluid, left ovarian cyst w/portion of fallopian tube  DISPOSITION OF SPECIMEN:  PATHOLOGY  COUNTS:  YES  TOURNIQUET:  * No tourniquets in log *  DICTATION: .Other Dictation: Dictation Number [id number]  PLAN OF CARE: Discharge to home after PACU  PATIENT DISPOSITION:  PACU - hemodynamically stable.   Delay start of Pharmacological VTE agent (>24hrs) due to surgical blood loss or risk of bleeding: not applicable

## 2018-04-10 NOTE — Anesthesia Postprocedure Evaluation (Signed)
Anesthesia Post Note  Patient: Sonia Harris  Procedure(s) Performed: LAPAROSCOPIC OOPHORECTOMY (Left ) LAPAROSCOPIC LYSIS OF ADHESIONS     Patient location during evaluation: PACU Anesthesia Type: General Level of consciousness: awake and alert Pain management: pain level controlled Vital Signs Assessment: post-procedure vital signs reviewed and stable Respiratory status: spontaneous breathing, nonlabored ventilation, respiratory function stable and patient connected to nasal cannula oxygen Cardiovascular status: blood pressure returned to baseline and stable Postop Assessment: no apparent nausea or vomiting Anesthetic complications: no    Last Vitals:  Vitals:   04/10/18 1457 04/10/18 1530  BP: (!) 107/53 106/67  Pulse: 77 79  Resp: 13 14  Temp: 36.4 C   SpO2: 94% 100%    Last Pain:  Vitals:   04/10/18 1530  TempSrc:   PainSc: 3    Pain Goal: Patients Stated Pain Goal: 3 (04/10/18 1530)               Tiajuana Amass
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
## 2018-04-11 ENCOUNTER — Encounter (HOSPITAL_COMMUNITY): Payer: Self-pay | Admitting: Obstetrics and Gynecology

## 2018-04-18 NOTE — Op Note (Signed)
NAMEKERRYANN, ALLAIRE MEDICAL RECORD PJ:[id number] ACCOUNT [id number] DATE OF BIRTH:25-Feb-1986 FACILITY: [facility] LOCATION: [place], [place]  OPERATIVE REPORT  DATE OF PROCEDURE:  04/10/2018  PREOPERATIVE DIAGNOSIS:  Left ovarian mass large left ovarian mass.  POSTOPERATIVE DIAGNOSES:  Left ovarian mass large left ovarian mass; omental adhesions.  PROCEDURE:   1.  Laparoscopic left oophorectomy with partial segment partial salpingectomy.   2.  Lysis of adhesions.  SURGEON:  Dr. [name].  ASSISTANT:  Dr. Janyth Pupa, MD  ANESTHESIA:  Local and general.  ESTIMATED BLOOD LOSS:  50.  BLOOD ADMINISTERED:  None.  DRAINS:  Foley catheter.    LOCAL:  Marcaine 0.25 less than 30 mL.  SPECIMEN:  Pelvic washings, ovarian cyst.  FLUIDS:  Left ovarian cyst with portion of fallopian tubes.  SPECIMENS:  To pathology.  PATIENT DISPOSITION:  To PACU hemodynamically stable.  COMPLICATIONS:  None.  FINDINGS:  Large benign appearing left ovarian cyst occupies left lower quadrant of the pelvis.  Omental adhesions from the left up to the abdominal wall.  Tubes consistent with previous history of tubal ligation.  Uterus normal.  Appendix and upper  abdomen normal.  DESCRIPTION OF PROCEDURE:  The patient was identified in the holding area.  She was then taken to the operating room with IV running.  She was placed in the dorsal lithotomy position and underwent general endotracheal anesthesia without complication.   SCDs were placed on her legs which was operating.  She received Ancef 2 grams IV prior to the start of the procedure.  A timeout was performed.  She was prepped and draped in a normal sterile fashion.  Attention was turned to the pelvis.  Graves speculum was inserted into the vagina.  The cervix was identified and grasped with a single tooth tenaculum.  Uterine manipulator was advanced through the cervix and the Hulka manipulator was attached to the   cervix.  I did confirm positioning of the uterus prior to placement of the manipulator.  Attention was turned to the abdomen.  The lower abdomen was distended,  normally we would place an incision in the umbilicus.  However, the top of the mass was 1-2 fingerbreadths below the belly button.  I did not want to puncture the cyst, so I went 3  fingerbreadths above the umbilicus at the midline.  Marcaine was used to inject through the subcutaneous and the skin.  The 5 mm trocar was then advanced under direct visualization into the abdomen.  The findings above were noted.  The other 2 ports  would be inserted in a similar fashion.  Because we were using a large EndoCatch bag, we used a 15 mm port on the right lower quadrant and in the left lower quadrant, we used another 5 mm port.  Once the camera was in, the trocars were inserted under direct visualization.  The cyst was slightly twisted on the infundibulum on the round ligament and the fallopian tube.  Again, the patient did have a history of a previous tubal ligation.  There was  no ecchymosis or any sign that there had been vascular compromise to the ovary, but it did appear to be slightly turned on the pedicle.  I preferred to identify the infundibulopelvic ligament and come from the pelvic side wall to the mass; however, the  mass again was twisted and was large, so we had to workup to transect the fallopian tubes, the round ligament and worked our way up through the infundibulopelvic ligament.  Before we started the procedure, we used a the suction-irrigator to get some pelvic washings.  The first thing we did as there were large omental adhesions on the left, so the cyst was not optimally.  That took about 20 minutes to take down the omental adhesions from  the abdominal wall.  The Harmonic scalpel was used to do so and there was no excessive bleeding.  Once the omental adhesions were out of the way, we were able to visualize the cyst and began to  transect the ovarian suspensory ligament and the fallopian tubes from the uterus up to the pelvic sidewall.  Again, we attempted to mobilize it, but because  of its size, we could not see the infundibulopelvic ligament.  Prior to removing the mass, we did see the ureter on the left side, which was well away from the mass and was deep in the pelvis.  Using the Harmonic scalpel,  the ovary was removed.  Once it was removed, we could see there was a large infundibulopelvic ligament that was hemostatic and was cauterized well with the Harmonic scalpel, we would eventually put Arista on prior to  completing the procedure for prophylactic hemostasis.  Once the cyst was free, we used the 15 bag, inserted it through the 15 mm port.  We were successful in getting the entire cyst in the bag.  Once the cyst within the bag, it was then brought to the fascia and we undrained the cyst with the needle.  We  would end up draining the cyst wall, probably end up draining about 3-400 just with the needle.  Eventually, we used an 11 blade scalpel to open the cyst that we could see what was in the bag.  We opened it in the bag slightly with the scalpel and put  the suction-irrigator and suctioned out all of the cyst fluid to decompress the cyst so that we would be able to remove it through our port.  There was over 700 mL of brown chocolate stained fluid that was removed and it was very clear.  There was no pus or no mucus  in it.  Once the cyst was deflated, the fascial incision was extended and the cyst was removed.  Again, the bag was on the outside of the abdomen, so everything was removed.  The cyst was removed intact with no spillage into the abdomen.  The cyst was  taken out of the bag.  It was smooth.  There were no nodules.  It appeared to be unilocular.  The bag was then removed after the cyst.  The trocar was then inserted.  Copious irrigation of the pelvis was performed and again, we looked at all pedicle sites  and we put Arista on the left pedicle.  The trocars were then removed under direct  visualization.  The fascial incision was not closed and I did retain.  The skin on the right lower quadrant was closed with 4-0 Monocryl.  The subcutaneous space of the two 5 mm ports were then reapproximated with 4-0 Monocryl.  All incisions had Dermabond was applied  to the remaining incisions and honeycomb dressing was put on the right lower quadrant.  Attention was turned to the pelvis.  The uterine manipulator was removed.  Tenaculum site was hemostatic.  The patient tolerated the procedure well.  AN/NUANCE  D:04/18/2018 T:04/18/2018 JOB:[id number]

## 2022-01-04 ENCOUNTER — Other Ambulatory Visit: Payer: Self-pay | Admitting: Nurse Practitioner

## 2022-01-04 DIAGNOSIS — N6452 Nipple discharge: Secondary | ICD-10-CM

## 2022-01-23 ENCOUNTER — Ambulatory Visit
Admission: RE | Admit: 2022-01-23 | Discharge: 2022-01-23 | Disposition: A | Discharge status: Home / Self Care | Payer: 59 | Source: Ambulatory Visit | Attending: Nurse Practitioner | Admitting: Nurse Practitioner

## 2022-01-23 ENCOUNTER — Other Ambulatory Visit: Payer: Self-pay | Admitting: Nurse Practitioner

## 2022-01-23 ENCOUNTER — Ambulatory Visit
Admission: RE | Admit: 2022-01-23 | Discharge: 2022-01-23 | Disposition: A | Discharge status: Home / Self Care | Payer: Commercial Managed Care - PPO | Source: Ambulatory Visit | Attending: Nurse Practitioner | Admitting: Nurse Practitioner

## 2022-01-23 DIAGNOSIS — N6320 Unspecified lump in the left breast, unspecified quadrant: Secondary | ICD-10-CM

## 2022-01-23 DIAGNOSIS — N6452 Nipple discharge: Secondary | ICD-10-CM

## 2022-01-23 IMAGING — US US BREAST*R* LIMITED INC AXILLA
2 series · 14 of 14 positions shown · non-contrast
Comparison: None.
COMPARISON: None.

Addendum:
CLINICAL DATA: Patient presents for evaluation of intermittent
spontaneous RIGHT nipple discharge. Symptoms for 7 months.



[Series 1: us breast*right* limited inc axilla · 0.07mm/px · 9 of 9 slices shown (1 of 2)]
[im 1/9]
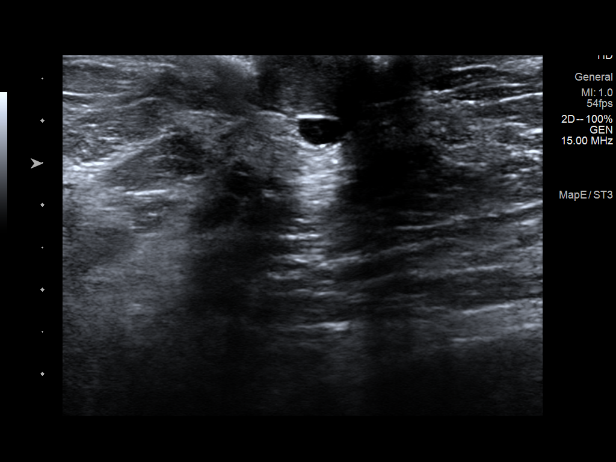
[im 2/9]
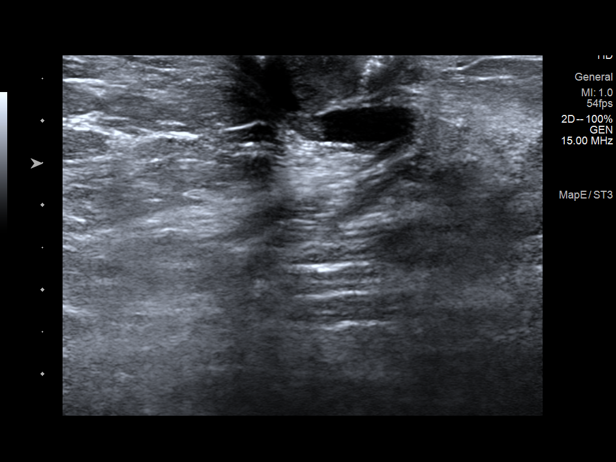
[im 3/9]
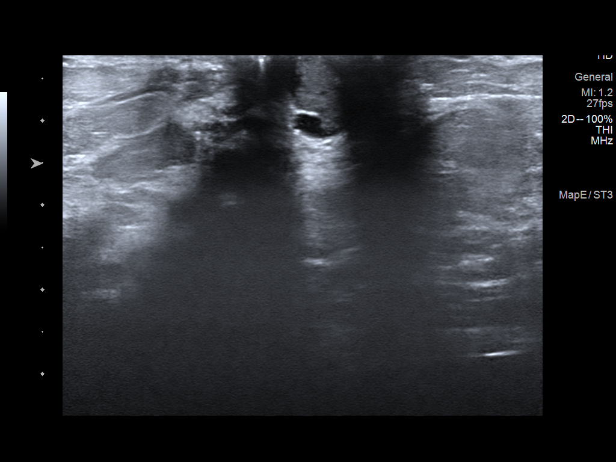
[im 4/9]
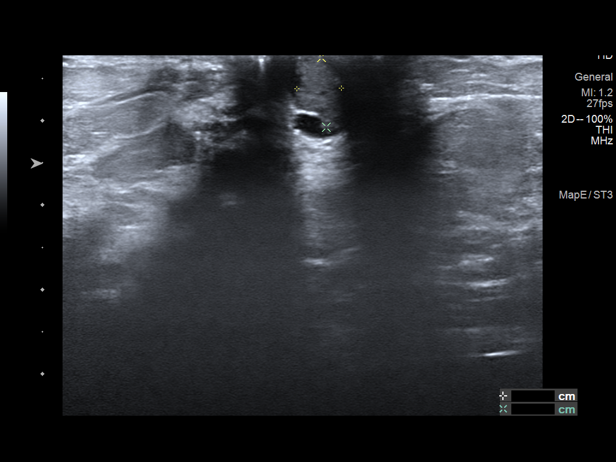
[im 5/9]
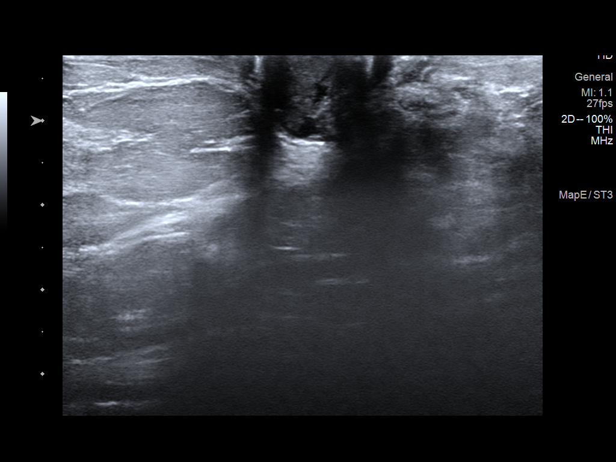
[im 6/9]
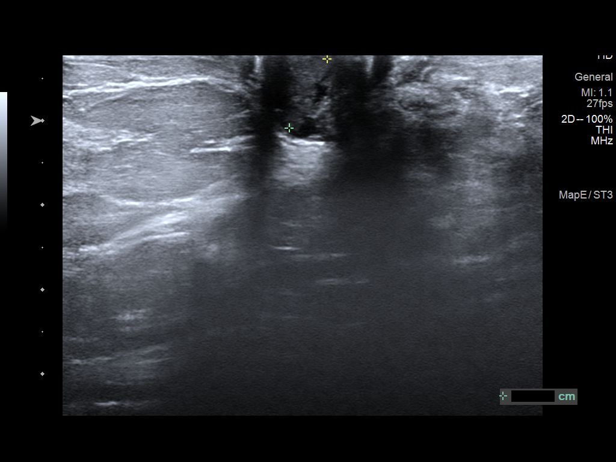
[im 7/9]
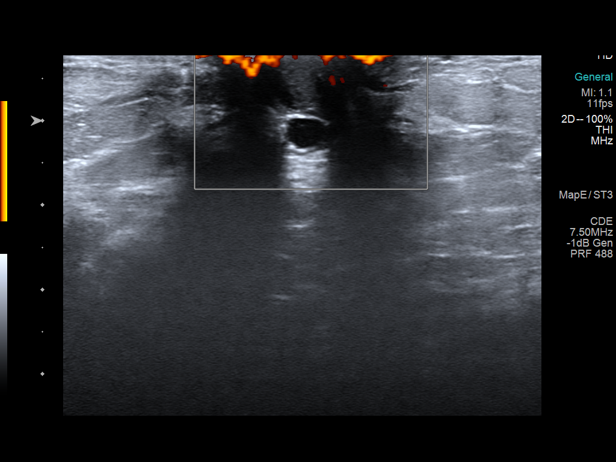
[im 8/9]
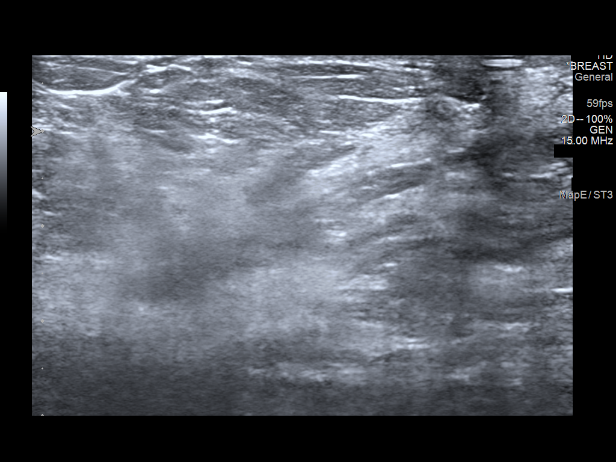
[im 9/9]
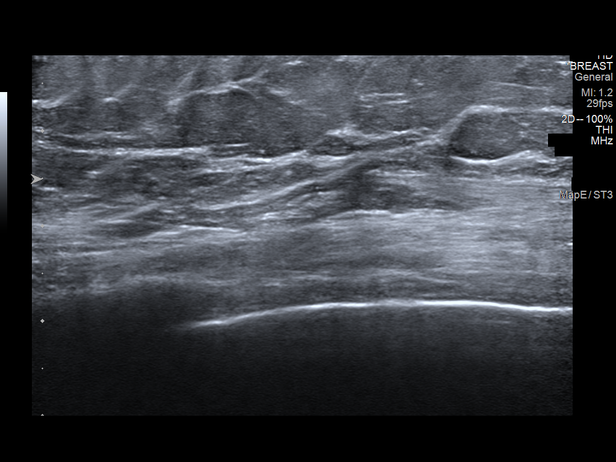

[Series 2: us breast*right* limited inc axilla · 0.05mm/px · 5 acquisitions, 5 frames shown (2 of 2)]
[im 1/5]
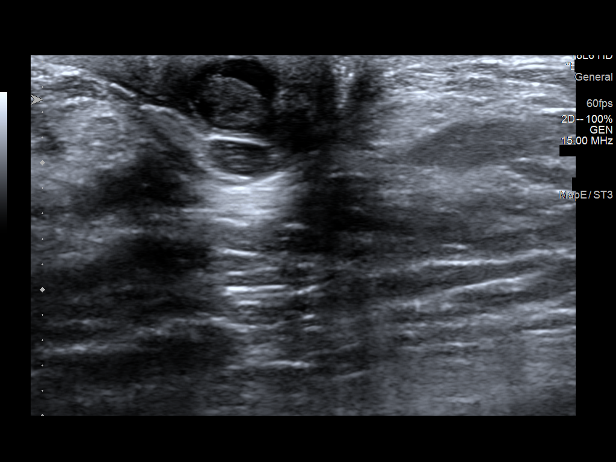
[im 2/5]
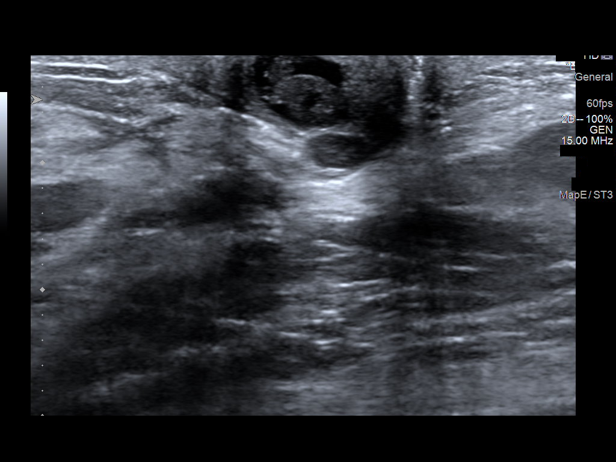
[im 3/5]
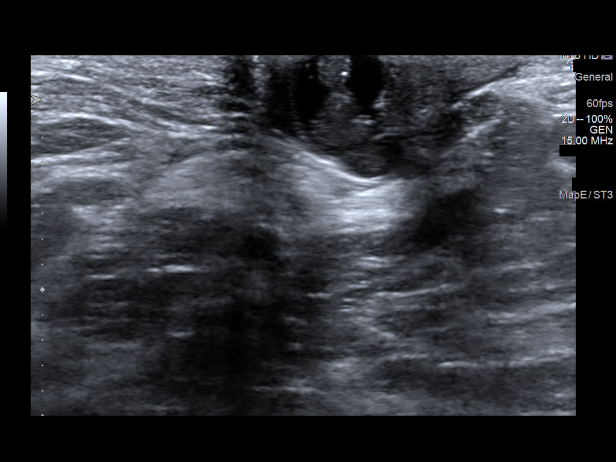
[im 4/5]
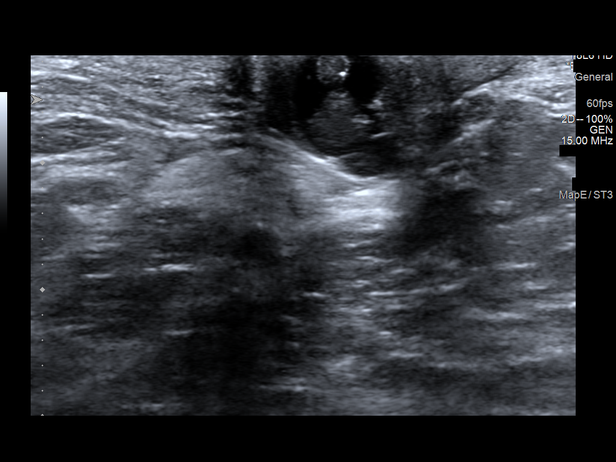
[im 5/5]
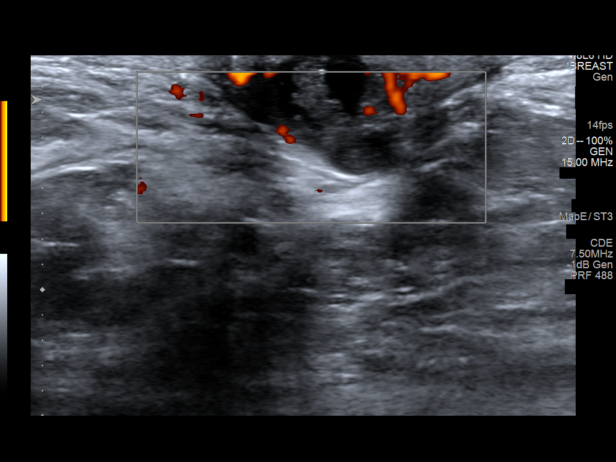

[14 of 14 positions shown; findings below may reference images not displayed]

ACR Breast Density Category c: The breast tissue is heterogeneously
dense, which may obscure small masses.
FINDINGS: RIGHT BREAST:

Mammogram: A focally dilated duct is identified within the LATERAL
retroareolar region of the RIGHT breast. Mammographic images were
processed with CAD.

Physical Exam: I am not able to express discharge from the RIGHT
nipple. There is no palpable abnormality in the retroareolar region
of the RIGHT breast.

Ultrasound: Targeted ultrasound is performed, showing a dilated duct
with hypoechoic mass in the nipple and extending into the
retroareolar region. The solid component demonstrates no blood flow
on Doppler evaluation. On real-time imaging, the solid component
appears mobile, likely indicating debris.

Evaluation of the RIGHT axilla is negative for adenopathy.

LEFT BREAST:

Mammogram: A circumscribed oval mass is identified within the LOWER
INNER QUADRANT of the LEFT breast and further evaluated with
ultrasound. Mammographic images were processed with CAD.

Ultrasound: Targeted ultrasound is performed, showing a
circumscribed hypoechoic oval mass in the 6 o'clock location of the
LEFT breast 5 centimeters from the nipple which measures 0.5 x 0 4 x
0.5 centimeters. No internal blood flow identified Doppler
evaluation.

Evaluation of the LEFT axilla is negative adenopathy.
IMPRESSION: 1. Dilated duct with internal debris versus solid mass of the RIGHT
breast.
2. Probably benign mass in the 6 o'clock location of the LEFT
breast.

RECOMMENDATION:
1. Recommend breast MRI and surgical consultation for evaluation of
RIGHT nipple discharge. Surgical consultation will be arranged for
the patient.
2. If no MRI finding, recommend ultrasound-guided core biopsy of the
retroareolar intraductal mass/debris in the RIGHT breast.
3. Recommend follow-up LEFT breast ultrasound in 6 months to assess
stability of probably benign mass in the 6 o'clock location.
However, if excision is needed in the RIGHT breast, recommend biopsy
of the mass in the LEFT breast.

I have discussed the findings and recommendations with the patient.
If applicable, a reminder letter will be sent to the patient
regarding the next appointment.

BI-RADS CATEGORY  4: Suspicious.

ADDENDUM:
Surgical consultation has been arranged with Dr. LUSIK at
[REDACTED] on [DATE].

LUSIK RN on [DATE]

*** End of Addendum ***
ACR Breast Density Category c: The breast tissue is heterogeneously
dense, which may obscure small masses.
FINDINGS: RIGHT BREAST:

Mammogram: A focally dilated duct is identified within the LATERAL
retroareolar region of the RIGHT breast. Mammographic images were
processed with CAD.

Physical Exam: I am not able to express discharge from the RIGHT
nipple. There is no palpable abnormality in the retroareolar region
of the RIGHT breast.

Ultrasound: Targeted ultrasound is performed, showing a dilated duct
with hypoechoic mass in the nipple and extending into the
retroareolar region. The solid component demonstrates no blood flow
on Doppler evaluation. On real-time imaging, the solid component
appears mobile, likely indicating debris.

Evaluation of the RIGHT axilla is negative for adenopathy.

LEFT BREAST:

Mammogram: A circumscribed oval mass is identified within the LOWER
INNER QUADRANT of the LEFT breast and further evaluated with
ultrasound. Mammographic images were processed with CAD.

Ultrasound: Targeted ultrasound is performed, showing a
circumscribed hypoechoic oval mass in the 6 o'clock location of the
LEFT breast 5 centimeters from the nipple which measures 0.5 x 0 4 x
0.5 centimeters. No internal blood flow identified Doppler
evaluation.

Evaluation of the LEFT axilla is negative adenopathy.
IMPRESSION: 1. Dilated duct with internal debris versus solid mass of the RIGHT
breast.
2. Probably benign mass in the 6 o'clock location of the LEFT
breast.

RECOMMENDATION:
1. Recommend breast MRI and surgical consultation for evaluation of
RIGHT nipple discharge. Surgical consultation will be arranged for
the patient.
2. If no MRI finding, recommend ultrasound-guided core biopsy of the
retroareolar intraductal mass/debris in the RIGHT breast.
3. Recommend follow-up LEFT breast ultrasound in 6 months to assess
stability of probably benign mass in the 6 o'clock location.
However, if excision is needed in the RIGHT breast, recommend biopsy
of the mass in the LEFT breast.

I have discussed the findings and recommendations with the patient.
If applicable, a reminder letter will be sent to the patient
regarding the next appointment.

BI-RADS CATEGORY  4: Suspicious.

## 2022-01-23 IMAGING — US US BREAST*L* LIMITED INC AXILLA
1 series · 9 of 9 positions shown · non-contrast
Comparison: None.
COMPARISON: None.

Addendum:
CLINICAL DATA: Patient presents for evaluation of intermittent
spontaneous RIGHT nipple discharge. Symptoms for 7 months.



[Series 1: us breast*left* limited inc axilla · 0.07mm/px · 9 of 9 slices shown]
[im 1/9]
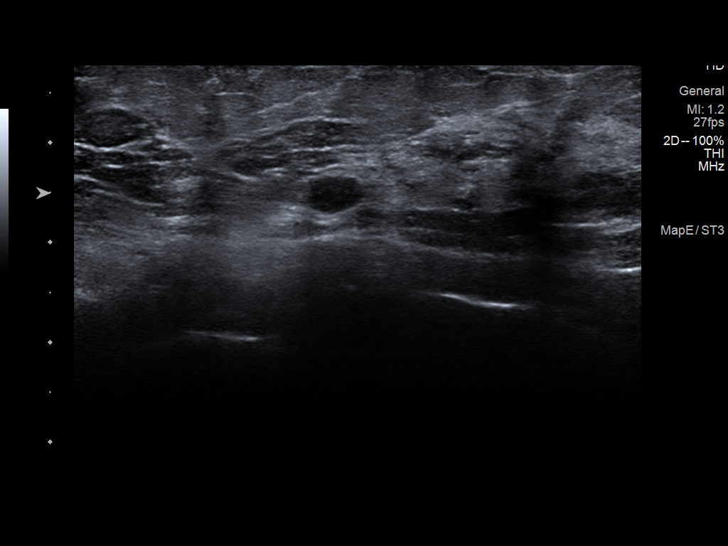
[im 2/9]
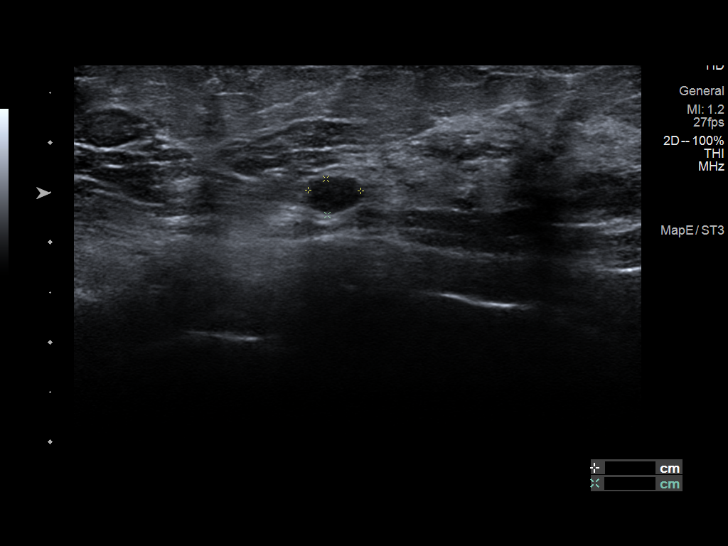
[im 3/9]
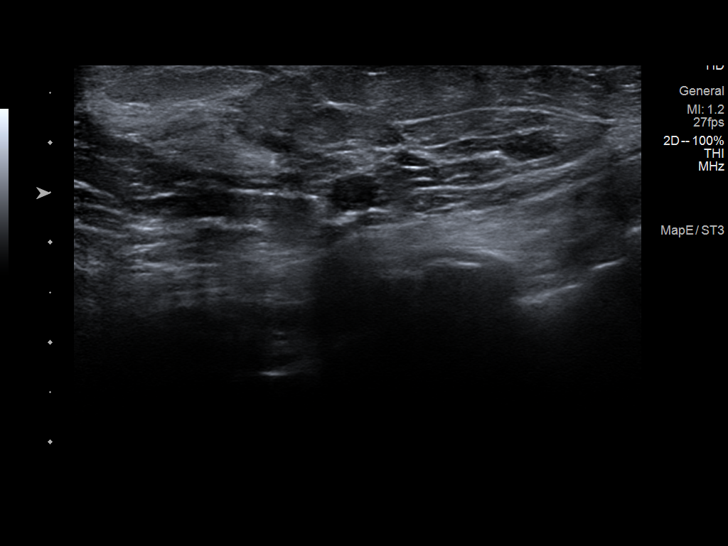
[im 4/9]
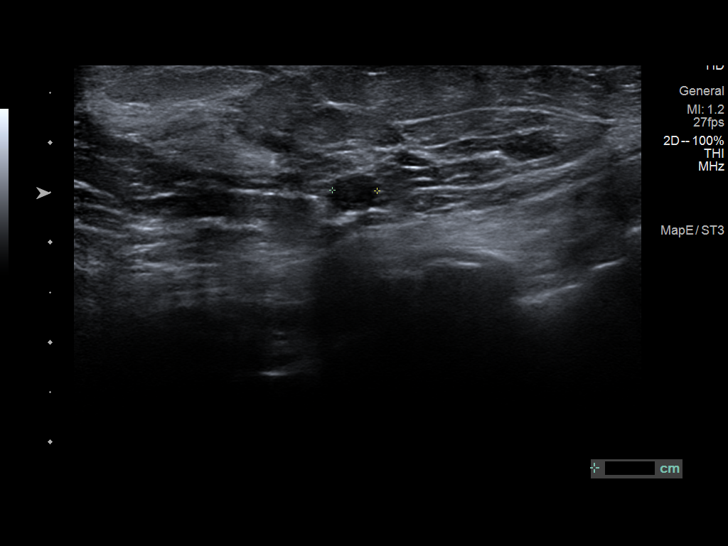
[im 5/9]
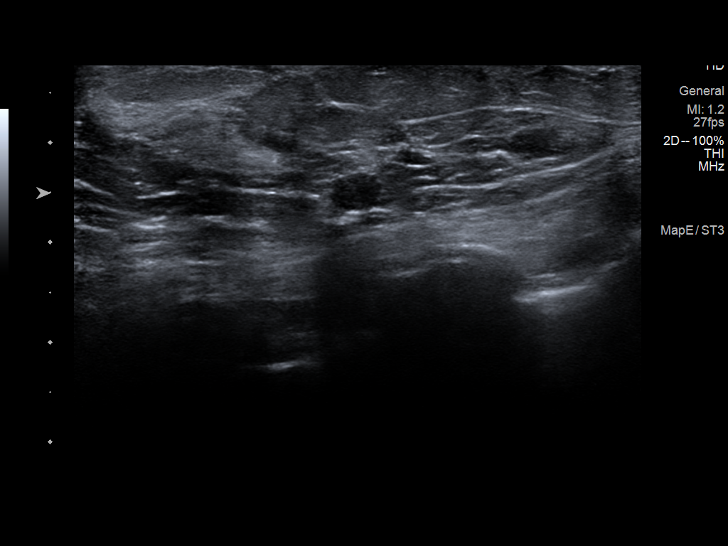
[im 6/9]
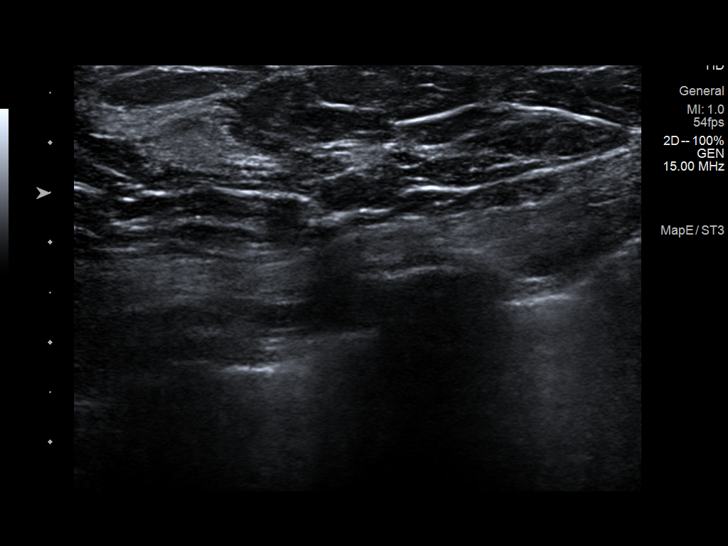
[im 7/9]
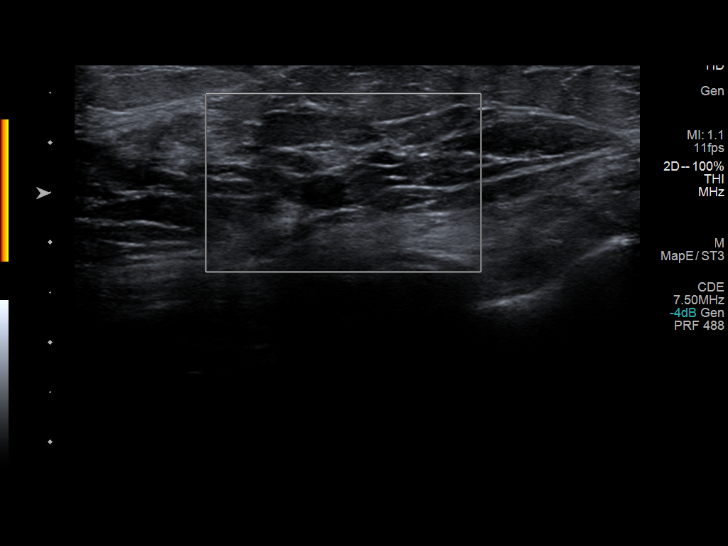
[im 8/9]
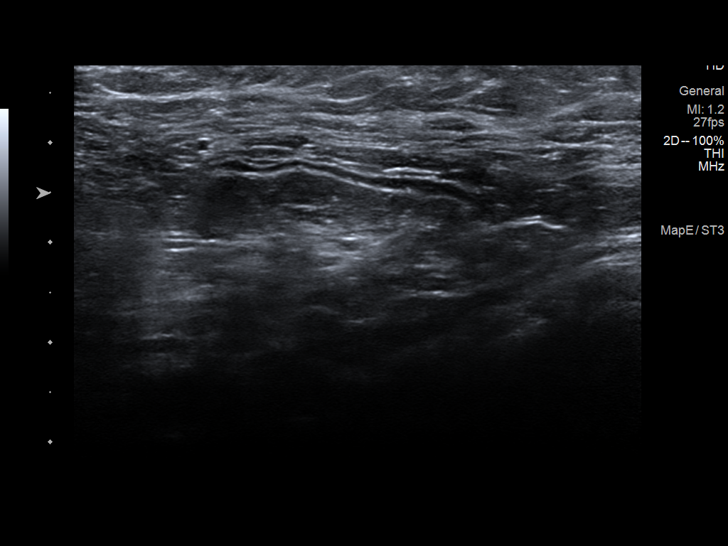
[im 9/9]
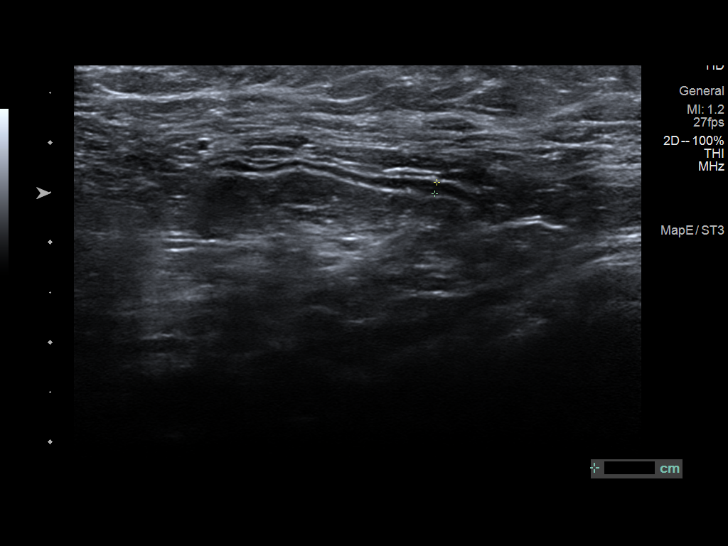

[9 of 9 positions shown; findings below may reference images not displayed]

ACR Breast Density Category c: The breast tissue is heterogeneously
dense, which may obscure small masses.
FINDINGS: RIGHT BREAST:

Mammogram: A focally dilated duct is identified within the LATERAL
retroareolar region of the RIGHT breast. Mammographic images were
processed with CAD.

Physical Exam: I am not able to express discharge from the RIGHT
nipple. There is no palpable abnormality in the retroareolar region
of the RIGHT breast.

Ultrasound: Targeted ultrasound is performed, showing a dilated duct
with hypoechoic mass in the nipple and extending into the
retroareolar region. The solid component demonstrates no blood flow
on Doppler evaluation. On real-time imaging, the solid component
appears mobile, likely indicating debris.

Evaluation of the RIGHT axilla is negative for adenopathy.

LEFT BREAST:

Mammogram: A circumscribed oval mass is identified within the LOWER
INNER QUADRANT of the LEFT breast and further evaluated with
ultrasound. Mammographic images were processed with CAD.

Ultrasound: Targeted ultrasound is performed, showing a
circumscribed hypoechoic oval mass in the 6 o'clock location of the
LEFT breast 5 centimeters from the nipple which measures 0.5 x 0 4 x
0.5 centimeters. No internal blood flow identified Doppler
evaluation.

Evaluation of the LEFT axilla is negative adenopathy.
IMPRESSION: 1. Dilated duct with internal debris versus solid mass of the RIGHT
breast.
2. Probably benign mass in the 6 o'clock location of the LEFT
breast.

RECOMMENDATION:
1. Recommend breast MRI and surgical consultation for evaluation of
RIGHT nipple discharge. Surgical consultation will be arranged for
the patient.
2. If no MRI finding, recommend ultrasound-guided core biopsy of the
retroareolar intraductal mass/debris in the RIGHT breast.
3. Recommend follow-up LEFT breast ultrasound in 6 months to assess
stability of probably benign mass in the 6 o'clock location.
However, if excision is needed in the RIGHT breast, recommend biopsy
of the mass in the LEFT breast.

I have discussed the findings and recommendations with the patient.
If applicable, a reminder letter will be sent to the patient
regarding the next appointment.

BI-RADS CATEGORY  4: Suspicious.

ADDENDUM:
Surgical consultation has been arranged with Dr. LUSIK at
[REDACTED] on [DATE].

LUSIK RN on [DATE]

*** End of Addendum ***
ACR Breast Density Category c: The breast tissue is heterogeneously
dense, which may obscure small masses.
FINDINGS: RIGHT BREAST:

Mammogram: A focally dilated duct is identified within the LATERAL
retroareolar region of the RIGHT breast. Mammographic images were
processed with CAD.

Physical Exam: I am not able to express discharge from the RIGHT
nipple. There is no palpable abnormality in the retroareolar region
of the RIGHT breast.

Ultrasound: Targeted ultrasound is performed, showing a dilated duct
with hypoechoic mass in the nipple and extending into the
retroareolar region. The solid component demonstrates no blood flow
on Doppler evaluation. On real-time imaging, the solid component
appears mobile, likely indicating debris.

Evaluation of the RIGHT axilla is negative for adenopathy.

LEFT BREAST:

Mammogram: A circumscribed oval mass is identified within the LOWER
INNER QUADRANT of the LEFT breast and further evaluated with
ultrasound. Mammographic images were processed with CAD.

Ultrasound: Targeted ultrasound is performed, showing a
circumscribed hypoechoic oval mass in the 6 o'clock location of the
LEFT breast 5 centimeters from the nipple which measures 0.5 x 0 4 x
0.5 centimeters. No internal blood flow identified Doppler
evaluation.

Evaluation of the LEFT axilla is negative adenopathy.
IMPRESSION: 1. Dilated duct with internal debris versus solid mass of the RIGHT
breast.
2. Probably benign mass in the 6 o'clock location of the LEFT
breast.

RECOMMENDATION:
1. Recommend breast MRI and surgical consultation for evaluation of
RIGHT nipple discharge. Surgical consultation will be arranged for
the patient.
2. If no MRI finding, recommend ultrasound-guided core biopsy of the
retroareolar intraductal mass/debris in the RIGHT breast.
3. Recommend follow-up LEFT breast ultrasound in 6 months to assess
stability of probably benign mass in the 6 o'clock location.
However, if excision is needed in the RIGHT breast, recommend biopsy
of the mass in the LEFT breast.

I have discussed the findings and recommendations with the patient.
If applicable, a reminder letter will be sent to the patient
regarding the next appointment.

BI-RADS CATEGORY  4: Suspicious.

## 2022-01-23 IMAGING — MG DIGITAL DIAGNOSTIC BILAT W/ TOMO W/ CAD
7 of 14 series · 7 of 40 positions shown · non-contrast
Comparison: None.
COMPARISON: None.

Addendum:
CLINICAL DATA: Patient presents for evaluation of intermittent
spontaneous RIGHT nipple discharge. Symptoms for 7 months.



[L ML synth-2D]
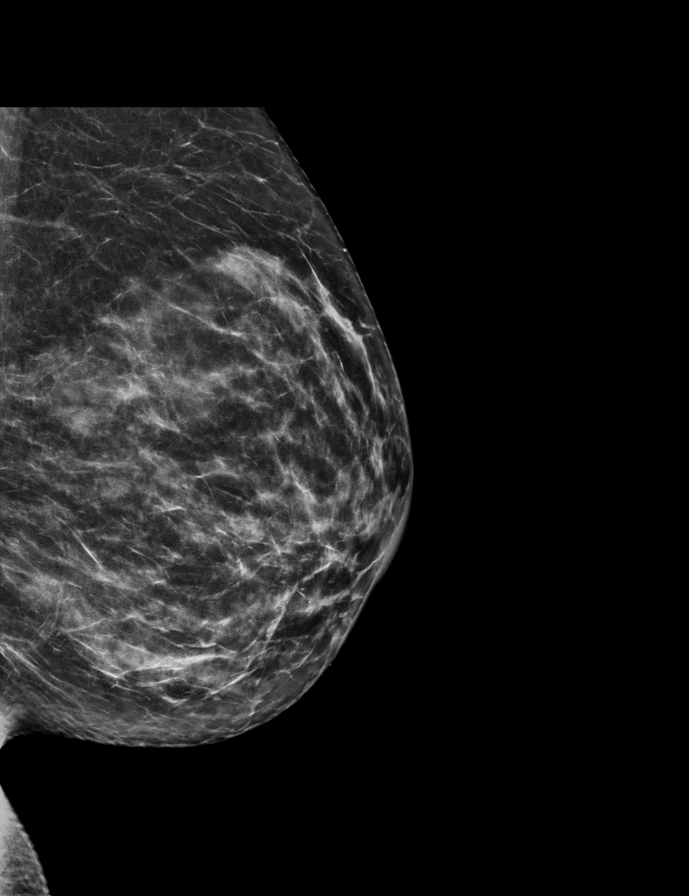

[R MLO synth-2D (1 of 2)]
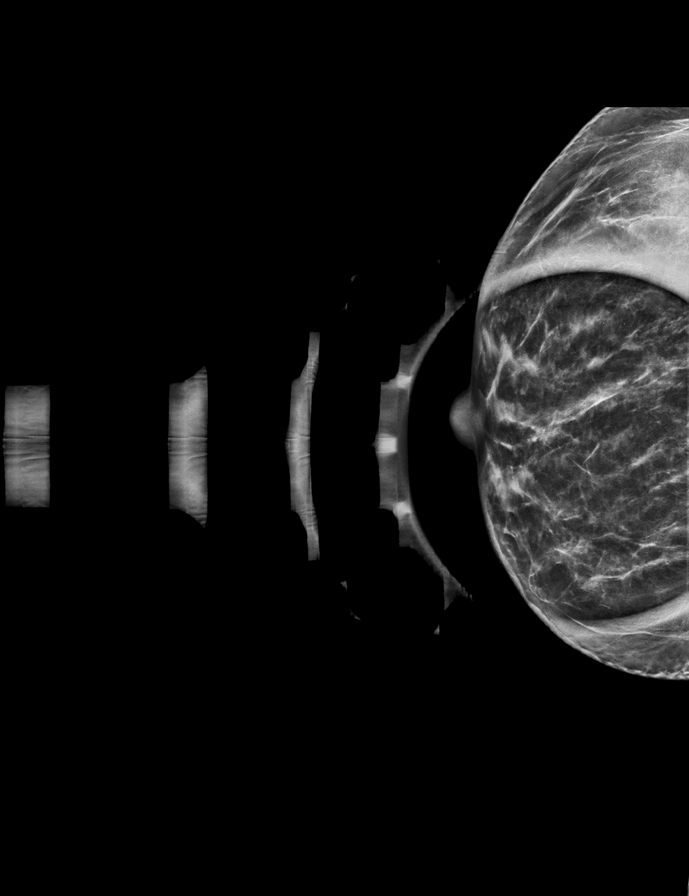

[R CC synth-2D]
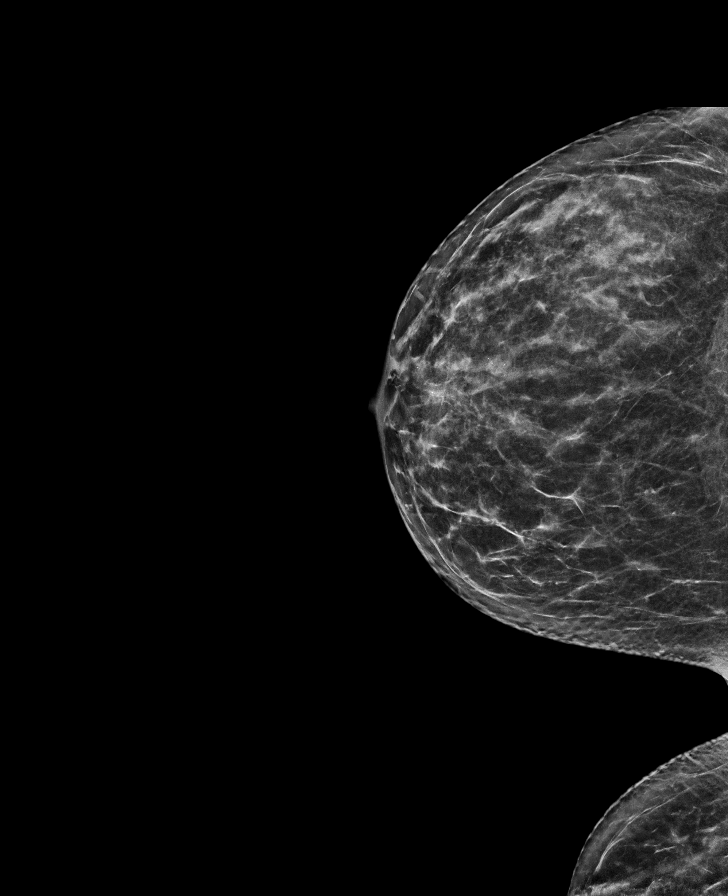

[L CC synth-2D (1 of 2)]
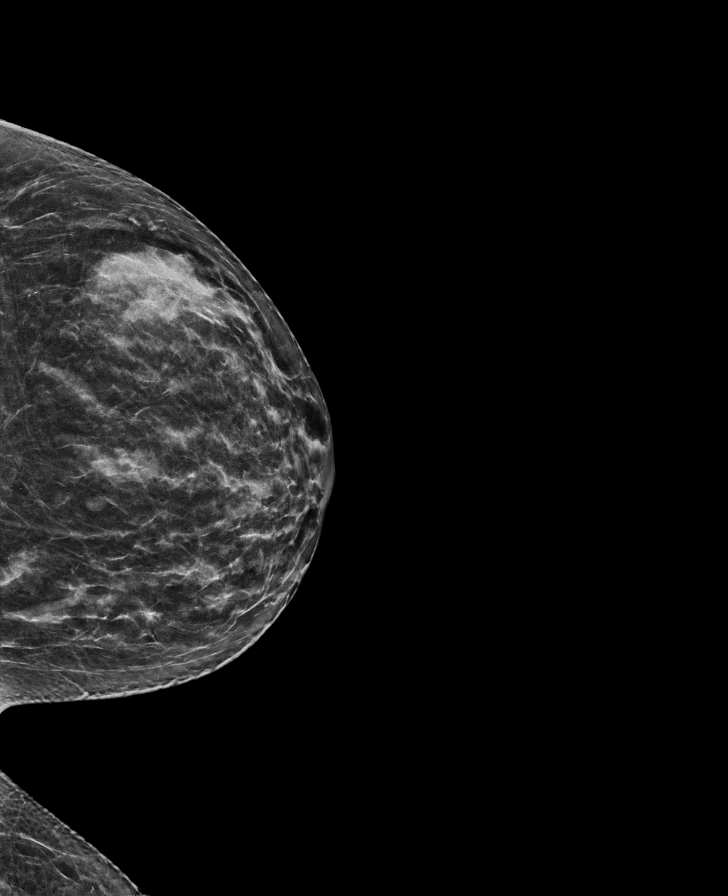

[L MLO synth-2D]
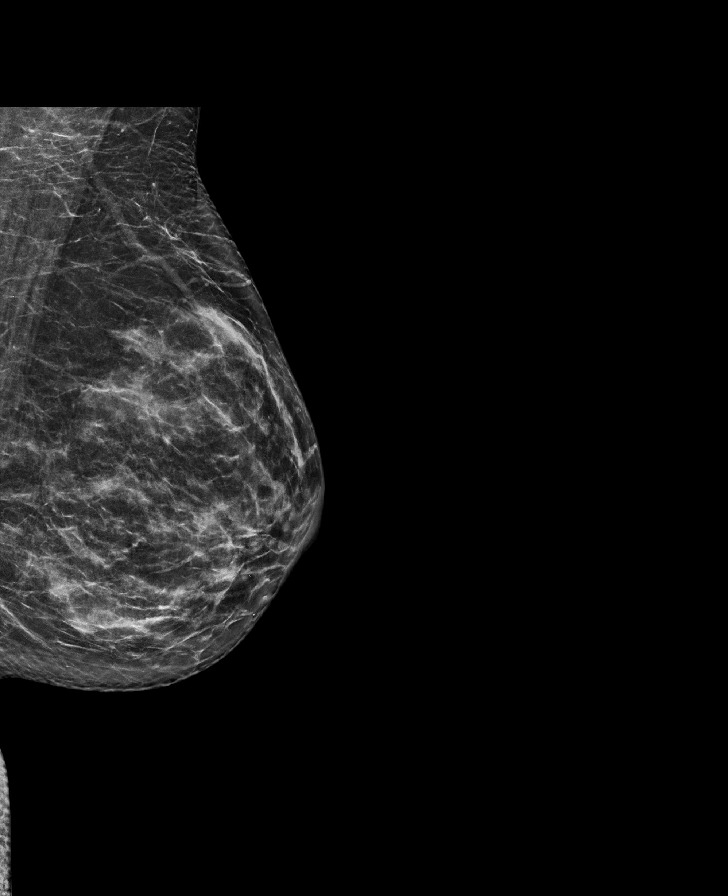

[R MLO synth-2D (2 of 2)]
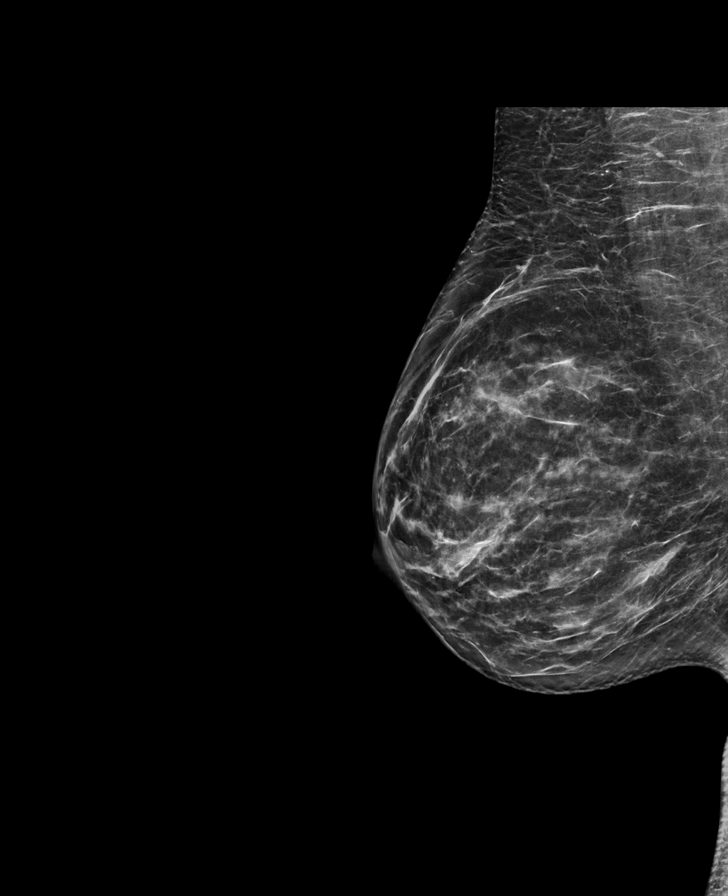

[L CC synth-2D (2 of 2)]
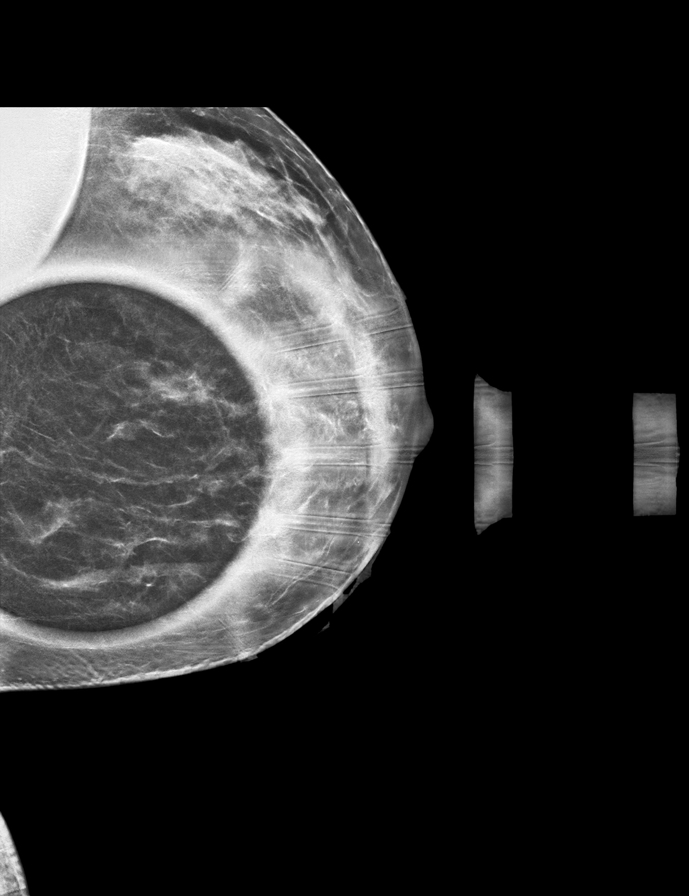

[7 of 40 positions shown; findings below may reference images not displayed]

ACR Breast Density Category c: The breast tissue is heterogeneously
dense, which may obscure small masses.
FINDINGS: RIGHT BREAST:

Mammogram: A focally dilated duct is identified within the LATERAL
retroareolar region of the RIGHT breast. Mammographic images were
processed with CAD.

Physical Exam: I am not able to express discharge from the RIGHT
nipple. There is no palpable abnormality in the retroareolar region
of the RIGHT breast.

Ultrasound: Targeted ultrasound is performed, showing a dilated duct
with hypoechoic mass in the nipple and extending into the
retroareolar region. The solid component demonstrates no blood flow
on Doppler evaluation. On real-time imaging, the solid component
appears mobile, likely indicating debris.

Evaluation of the RIGHT axilla is negative for adenopathy.

LEFT BREAST:

Mammogram: A circumscribed oval mass is identified within the LOWER
INNER QUADRANT of the LEFT breast and further evaluated with
ultrasound. Mammographic images were processed with CAD.

Ultrasound: Targeted ultrasound is performed, showing a
circumscribed hypoechoic oval mass in the 6 o'clock location of the
LEFT breast 5 centimeters from the nipple which measures 0.5 x 0 4 x
0.5 centimeters. No internal blood flow identified Doppler
evaluation.

Evaluation of the LEFT axilla is negative adenopathy.
IMPRESSION: 1. Dilated duct with internal debris versus solid mass of the RIGHT
breast.
2. Probably benign mass in the 6 o'clock location of the LEFT
breast.

RECOMMENDATION:
1. Recommend breast MRI and surgical consultation for evaluation of
RIGHT nipple discharge. Surgical consultation will be arranged for
the patient.
2. If no MRI finding, recommend ultrasound-guided core biopsy of the
retroareolar intraductal mass/debris in the RIGHT breast.
3. Recommend follow-up LEFT breast ultrasound in 6 months to assess
stability of probably benign mass in the 6 o'clock location.
However, if excision is needed in the RIGHT breast, recommend biopsy
of the mass in the LEFT breast.

I have discussed the findings and recommendations with the patient.
If applicable, a reminder letter will be sent to the patient
regarding the next appointment.

BI-RADS CATEGORY  4: Suspicious.

ADDENDUM:
Surgical consultation has been arranged with Dr. LUSIK at
[REDACTED] on [DATE].

LUSIK RN on [DATE]

*** End of Addendum ***
ACR Breast Density Category c: The breast tissue is heterogeneously
dense, which may obscure small masses.
FINDINGS: RIGHT BREAST:

Mammogram: A focally dilated duct is identified within the LATERAL
retroareolar region of the RIGHT breast. Mammographic images were
processed with CAD.

Physical Exam: I am not able to express discharge from the RIGHT
nipple. There is no palpable abnormality in the retroareolar region
of the RIGHT breast.

Ultrasound: Targeted ultrasound is performed, showing a dilated duct
with hypoechoic mass in the nipple and extending into the
retroareolar region. The solid component demonstrates no blood flow
on Doppler evaluation. On real-time imaging, the solid component
appears mobile, likely indicating debris.

Evaluation of the RIGHT axilla is negative for adenopathy.

LEFT BREAST:

Mammogram: A circumscribed oval mass is identified within the LOWER
INNER QUADRANT of the LEFT breast and further evaluated with
ultrasound. Mammographic images were processed with CAD.

Ultrasound: Targeted ultrasound is performed, showing a
circumscribed hypoechoic oval mass in the 6 o'clock location of the
LEFT breast 5 centimeters from the nipple which measures 0.5 x 0 4 x
0.5 centimeters. No internal blood flow identified Doppler
evaluation.

Evaluation of the LEFT axilla is negative adenopathy.
IMPRESSION: 1. Dilated duct with internal debris versus solid mass of the RIGHT
breast.
2. Probably benign mass in the 6 o'clock location of the LEFT
breast.

RECOMMENDATION:
1. Recommend breast MRI and surgical consultation for evaluation of
RIGHT nipple discharge. Surgical consultation will be arranged for
the patient.
2. If no MRI finding, recommend ultrasound-guided core biopsy of the
retroareolar intraductal mass/debris in the RIGHT breast.
3. Recommend follow-up LEFT breast ultrasound in 6 months to assess
stability of probably benign mass in the 6 o'clock location.
However, if excision is needed in the RIGHT breast, recommend biopsy
of the mass in the LEFT breast.

I have discussed the findings and recommendations with the patient.
If applicable, a reminder letter will be sent to the patient
regarding the next appointment.

BI-RADS CATEGORY  4: Suspicious.

## 2022-02-16 ENCOUNTER — Other Ambulatory Visit: Payer: Self-pay | Admitting: General Surgery

## 2022-02-16 DIAGNOSIS — N6452 Nipple discharge: Secondary | ICD-10-CM

## 2022-02-17 ENCOUNTER — Other Ambulatory Visit: Payer: Self-pay | Admitting: General Surgery

## 2022-02-17 DIAGNOSIS — N6452 Nipple discharge: Secondary | ICD-10-CM

## 2022-02-19 ENCOUNTER — Ambulatory Visit
Admission: RE | Admit: 2022-02-19 | Discharge: 2022-02-19 | Disposition: A | Discharge status: Home / Self Care | Payer: 59 | Source: Ambulatory Visit | Attending: General Surgery | Admitting: General Surgery

## 2022-02-19 DIAGNOSIS — N6452 Nipple discharge: Secondary | ICD-10-CM

## 2022-02-19 IMAGING — MR MR BREAST BILAT WO/W CM
8 of 12 series · 32 of 48 positions shown · IV contrast (7ml gadavist)
Comparison: No prior MRI available for comparison. Correlation made
with prior mammogram and ultrasound images.

CLINICAL DATA: 35-year-old female with history of spontaneous clear
right nipple discharge for 6 months.

EXAM:
BILATERAL BREAST MRI WITH AND WITHOUT CONTRAST
TECHNIQUE: Multiplanar, multisequence MR images of both breasts were obtained
prior to and following the intravenous administration of 7 ml of
Gadavist

[Series 2: t2_tirm_tra ipat (a-p) · axial · 3.0mm · 0.74mm/px · 1 of 55 slices shown]
[im 1/55]
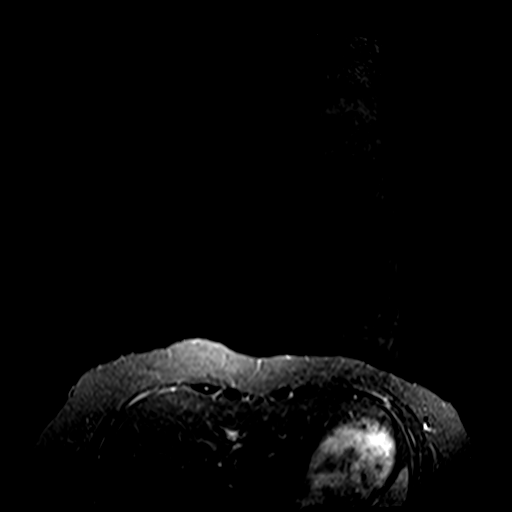

[Series 3: fl3d pre-cm no · axial · non-contrast · 1.2mm · 0.99mm/px · z∈[-92,+79]mm · 5 of 144 slices shown]
[im 1/144]
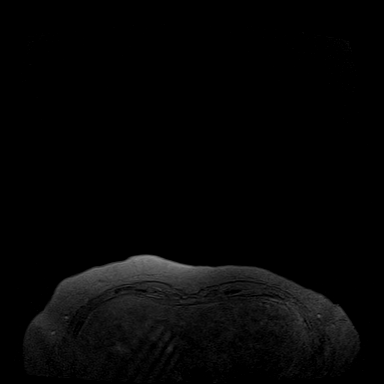
[im 36/144]
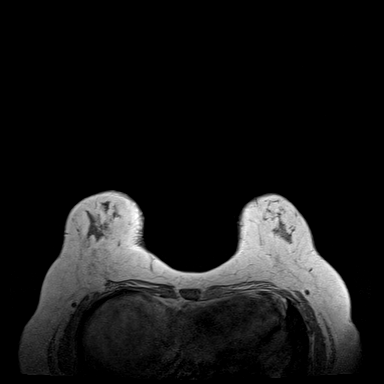
[im 72/144]
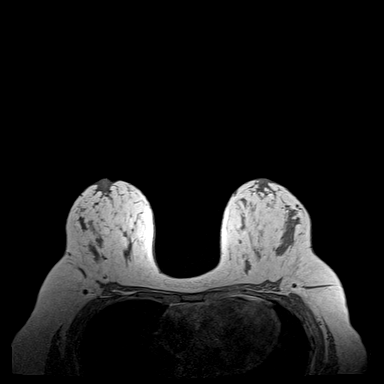
[im 108/144]
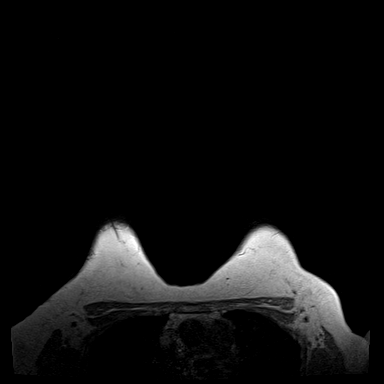
[im 144/144]
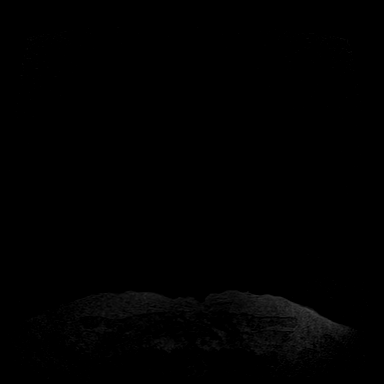

[Series 4: fl3d pre-cm · axial · non-contrast · 1.2mm · 0.99mm/px · z∈[-92,+79]mm · 5 of 144 slices shown]
[im 1/144]
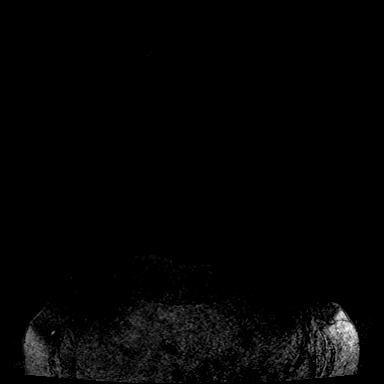
[im 36/144]
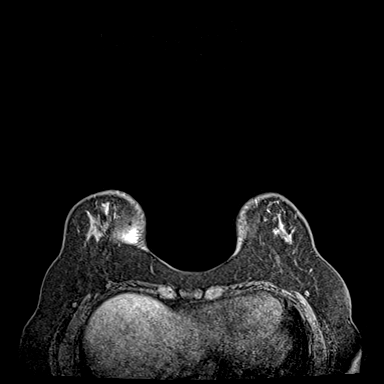
[im 72/144]
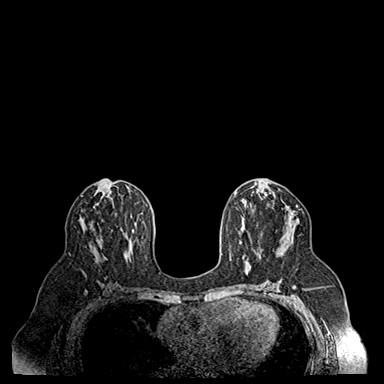
[im 108/144]
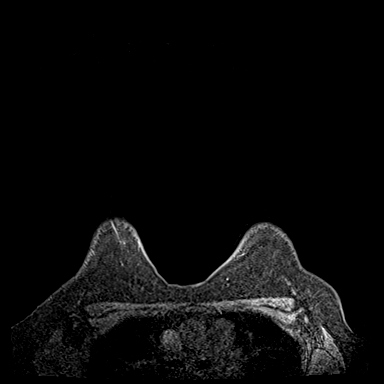
[im 144/144]
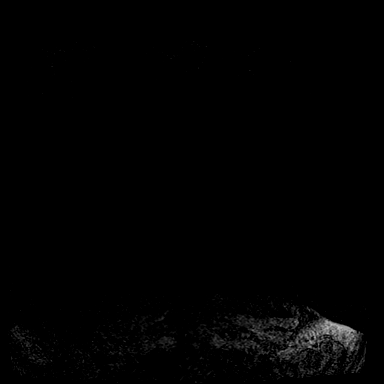

[Series 5: fl3d post-cm 20 · axial · 1.2mm · 0.99mm/px · z∈[-92,+79]mm · 5 of 144 slices shown (1 of 3)]
[im 1/144]
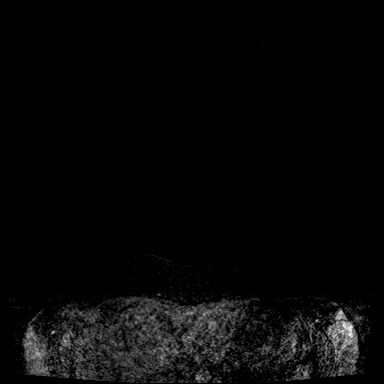
[im 36/144]
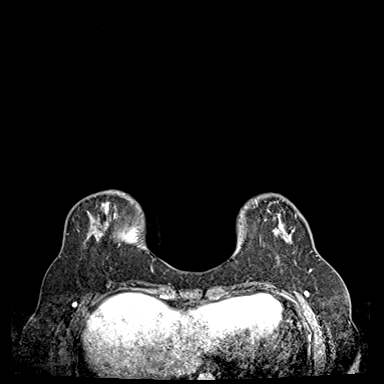
[im 72/144]
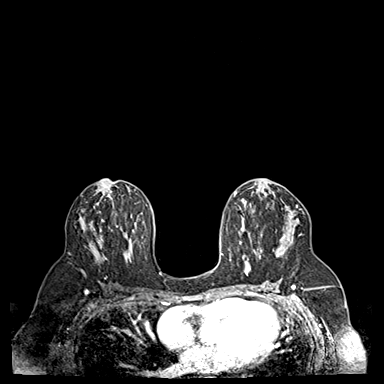
[im 108/144]
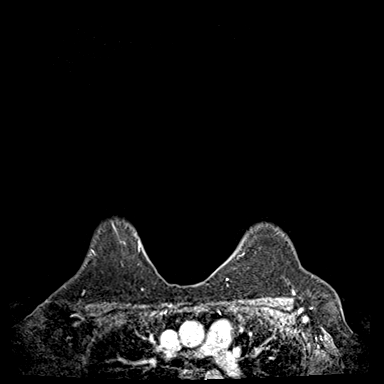
[im 144/144]
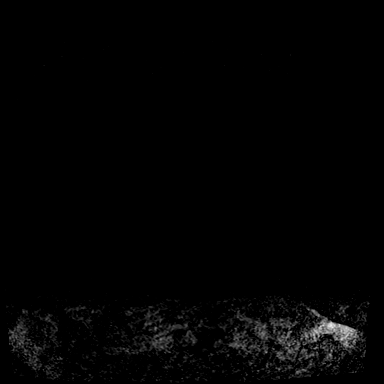

[Series 6: fl3d post-cm 20 · axial · 1.2mm · 0.99mm/px · z∈[-92,+79]mm · 5 of 144 slices shown (2 of 3)]
[im 1/144]
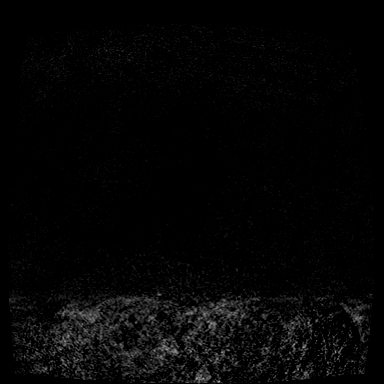
[im 36/144]
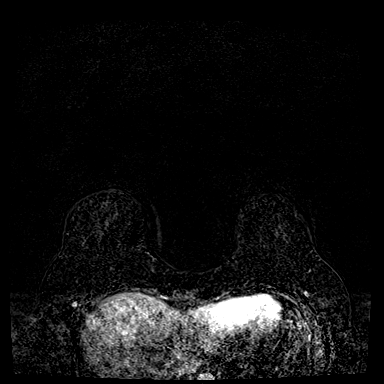
[im 72/144]
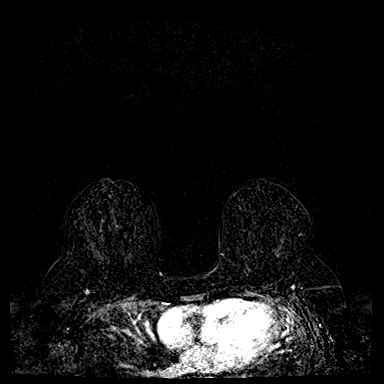
[im 108/144]
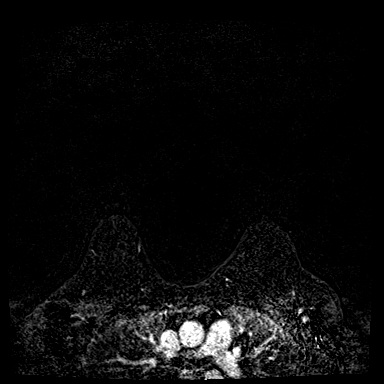
[im 144/144]
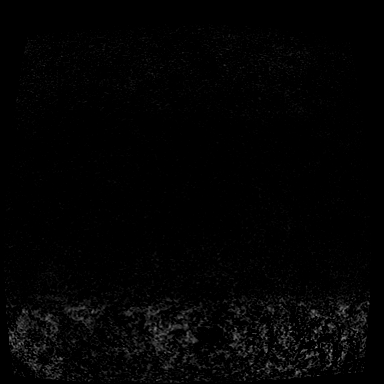

[Series 7: fl3d post-cm 20 · axial · 172.8mm · 0.99mm/px · 1 of 1 slices shown (3 of 3)]
[im 1/1]
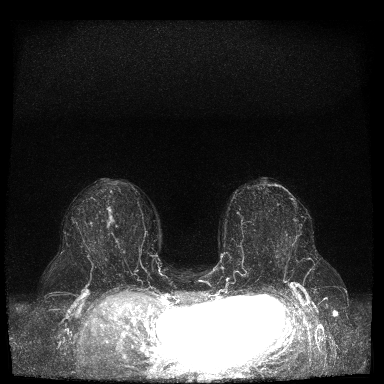

[Series 8: fl3d post-cm 3 · axial · 1.2mm · 0.99mm/px · z∈[-92,+79]mm · 6 of 144 slices shown (1 of 2)]
[im 1/144]
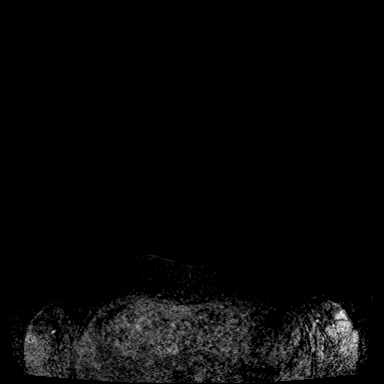
[im 29/144]
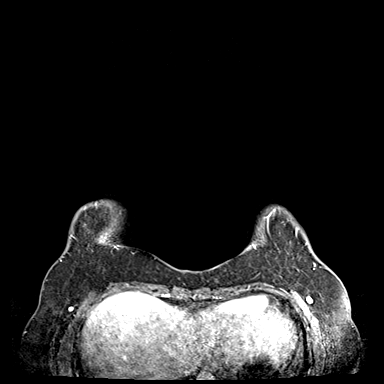
[im 58/144]
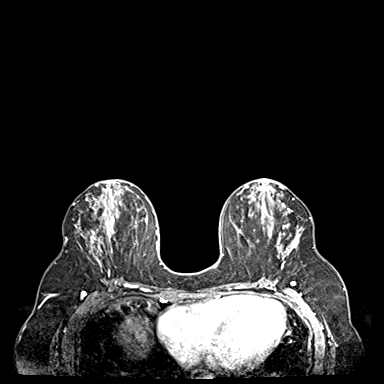
[im 86/144]
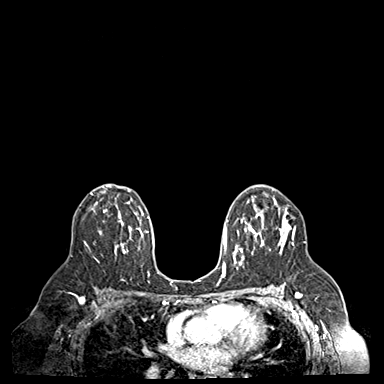
[im 115/144]
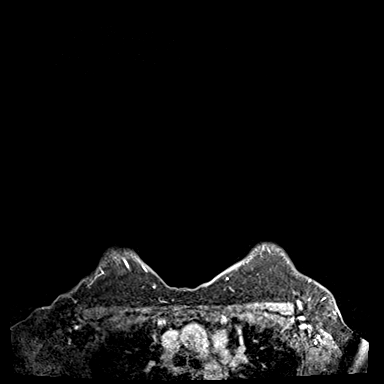
[im 144/144]
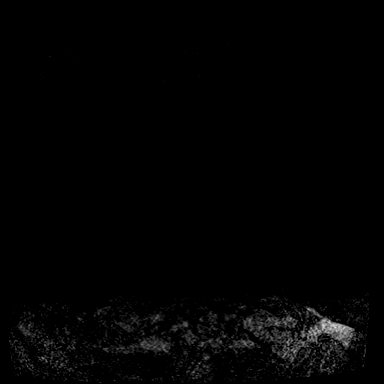

[Series 9: fl3d post-cm 3 · axial · 1.2mm · 0.99mm/px · z∈[-92,+9]mm · 4 of 144 slices shown (2 of 2)]
[im 1/144]
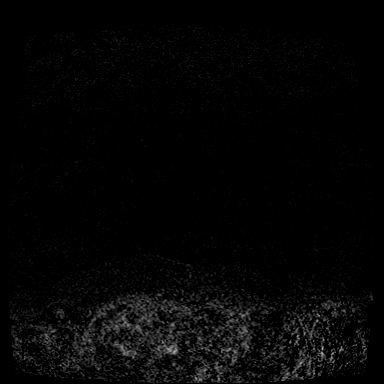
[im 29/144]
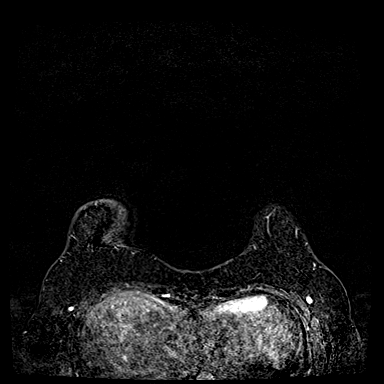
[im 58/144]
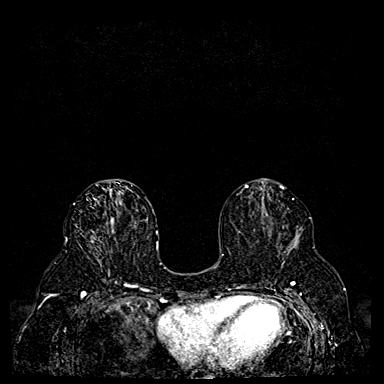
[im 86/144]
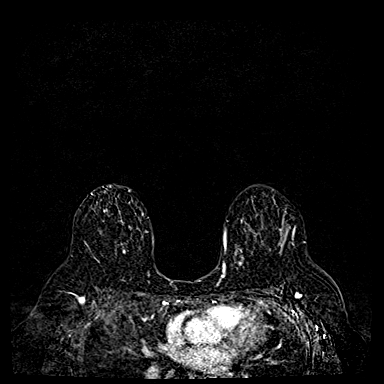

[32 of 48 positions shown; findings below may reference images not displayed]

Three-dimensional MR images were rendered by post-processing of the
original MR data on an independent workstation. The
three-dimensional MR images were interpreted, and findings are
reported in the following complete MRI report for this study. Three
dimensional images were evaluated at the independent interpreting
workstation using the DynaCAD thin client.
FINDINGS: Breast composition: c. Heterogeneous fibroglandular tissue.

Background parenchymal enhancement: Minimal.

Right breast: There is linear non mass enhancement in the central
right breast measuring 2.1 cm (series 6, image 79). This is located
within an area of segmentally dilated ducts. There is no enhancement
within the nipple to correspond with the soft tissue seen within the
nipple on targeted ultrasound. No other abnormal enhancement is
found within the right breast.

Left breast: No mass or abnormal enhancement.

Lymph nodes: No abnormal appearing lymph nodes.

Ancillary findings:  None.
IMPRESSION: 1. There is a 2.1 cm area of linear non mass enhancement in the
central right breast with in dilated ducts.

2. There is no enhancement with in the nipple itself in the right
breast to correspond with the soft tissue found on ultrasound. This
may represent intraductal debris.

3.  No evidence of left breast malignancy.

RECOMMENDATION:
MRI guided biopsy is recommended for the of linear non mass
enhancement in the right breast (series 6, image 79).

BI-RADS CATEGORY  4: Suspicious.

## 2022-02-19 MED ORDER — GADOBUTROL 1 MMOL/ML IV SOLN
7.0000 mL | Freq: Once | INTRAVENOUS | Status: AC | PRN
  Administered 2022-02-19: 7 mL via INTRAVENOUS

## 2022-02-21 ENCOUNTER — Other Ambulatory Visit: Payer: Self-pay | Admitting: General Surgery

## 2022-02-21 DIAGNOSIS — N6452 Nipple discharge: Secondary | ICD-10-CM

## 2022-02-21 DIAGNOSIS — R9389 Abnormal findings on diagnostic imaging of other specified body structures: Secondary | ICD-10-CM

## 2022-03-02 ENCOUNTER — Ambulatory Visit
Admission: RE | Admit: 2022-03-02 | Discharge: 2022-03-02 | Disposition: A | Discharge status: Home / Self Care | Payer: 59 | Source: Ambulatory Visit | Attending: General Surgery | Admitting: General Surgery

## 2022-03-02 ENCOUNTER — Other Ambulatory Visit (HOSPITAL_COMMUNITY): Payer: Self-pay | Admitting: Diagnostic Radiology

## 2022-03-02 DIAGNOSIS — R9389 Abnormal findings on diagnostic imaging of other specified body structures: Secondary | ICD-10-CM

## 2022-03-02 DIAGNOSIS — N6452 Nipple discharge: Secondary | ICD-10-CM

## 2022-03-02 IMAGING — MR MR BREAST BX W/ LOC DEV 1ST LEASION IMAGE BX SPEC MR GUIDE*R*
6 of 8 series · 29 of 48 positions shown · IV contrast (multihance)
Comparison: Prior studies.
COMPARISON: Prior studies.
COMPARISON: Prior studies.

Addendum:
CLINICAL DATA: Patient presents for MRI guided core biopsy of RIGHT
breast.

EXAM:
MRI GUIDED CORE NEEDLE BIOPSY OF THE RIGHT BREAST
TECHNIQUE: Multiplanar, multisequence MR imaging of the RIGTH breast was
performed both before and after administration of intravenous
contrast.
CONTRAST:  7mL MULTIHANCE GADOBENATE DIMEGLUMINE 529 MG/ML IV SOLN

[Series 2: fiducial unilateral · sagittal · 2.0mm · 1.33mm/px · 1 of 52 slices shown]
[im 1/52]
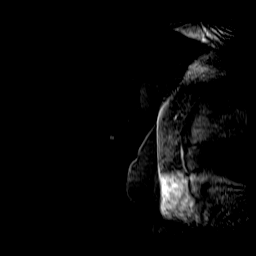

[Series 3: dynamic pre · axial · non-contrast · 1.3mm · 0.73mm/px · z∈[-65,+121]mm · 6 of 144 slices shown]
[im 1/144]
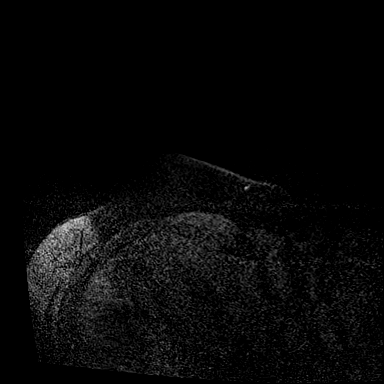
[im 29/144]
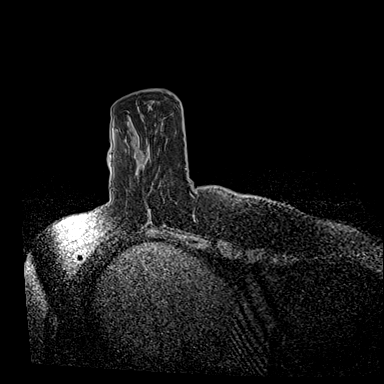
[im 58/144]
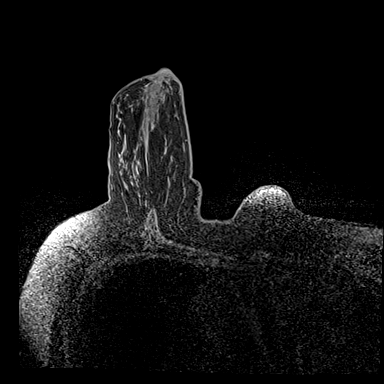
[im 86/144]
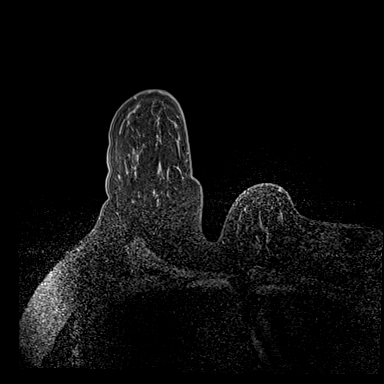
[im 115/144]
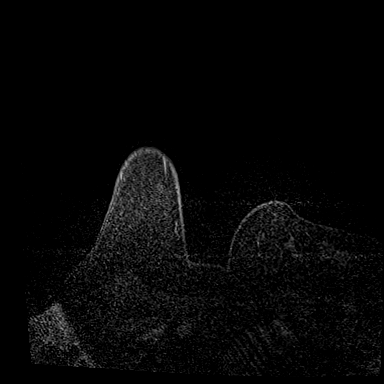
[im 144/144]
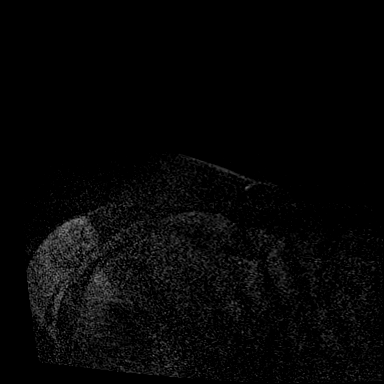

[Series 4: dynamic post 20 · axial · 1.3mm · 0.73mm/px · z∈[-65,+121]mm · 6 of 144 slices shown (1 of 2)]
[im 1/144]
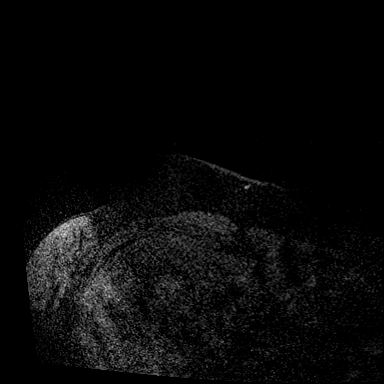
[im 29/144]
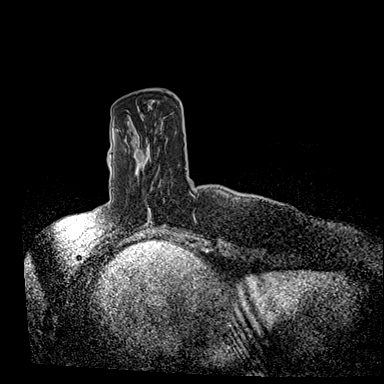
[im 58/144]
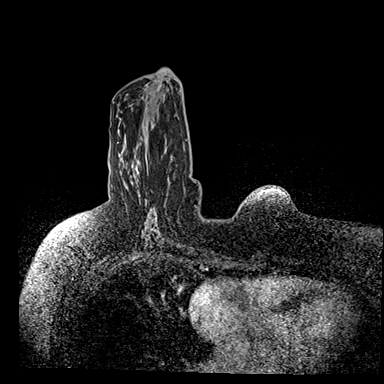
[im 86/144]
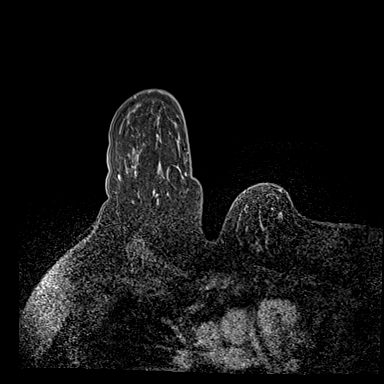
[im 115/144]
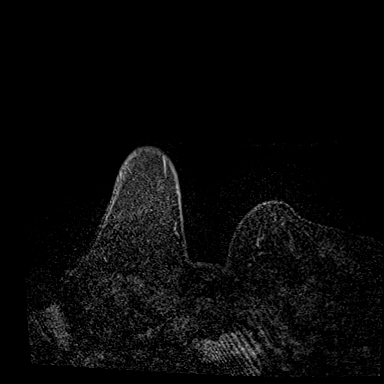
[im 144/144]
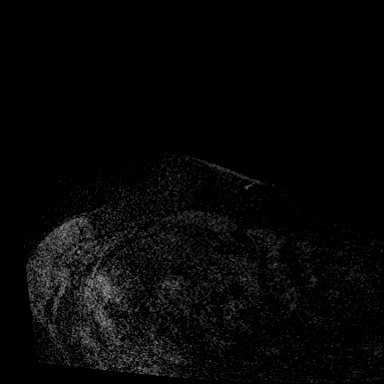

[Series 5: dynamic post 20 · axial · 1.3mm · 0.73mm/px · z∈[-65,+121]mm · 7 of 144 slices shown (2 of 2)]
[im 1/144]
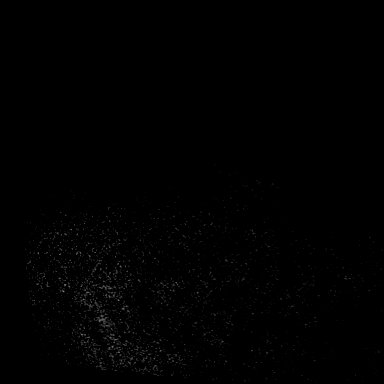
[im 24/144]
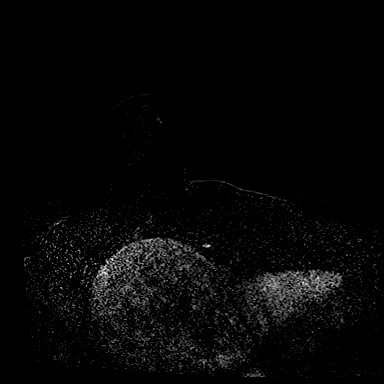
[im 48/144]
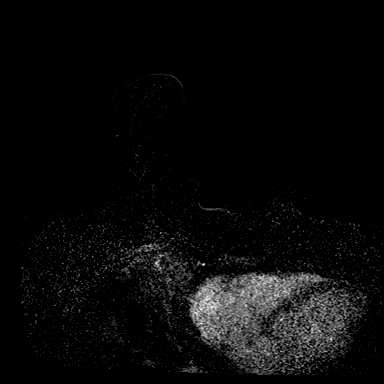
[im 72/144]
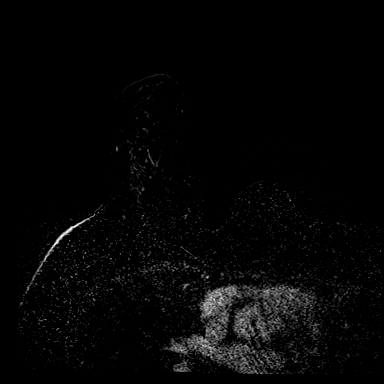
[im 96/144]
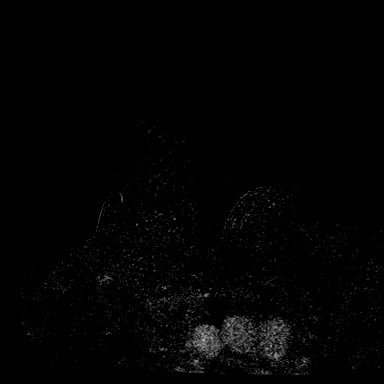
[im 120/144]
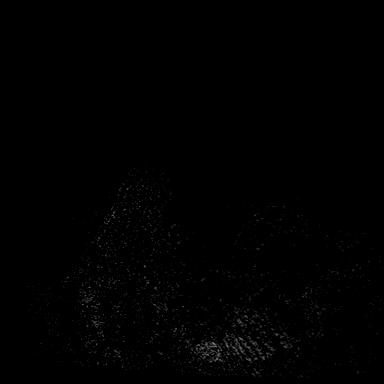
[im 144/144]
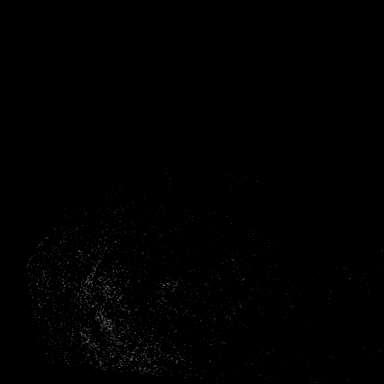

[Series 6: needle confirmation · axial · 1.3mm · 0.73mm/px · z∈[-65,+121]mm · 7 of 144 slices shown]
[im 1/144]
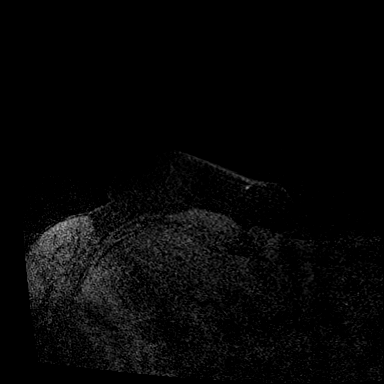
[im 24/144]
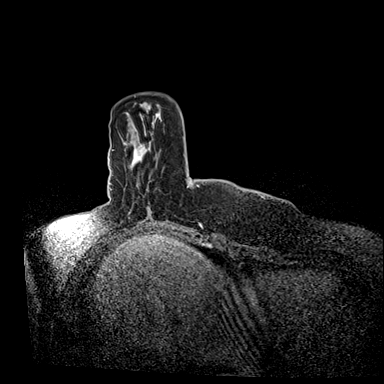
[im 48/144]
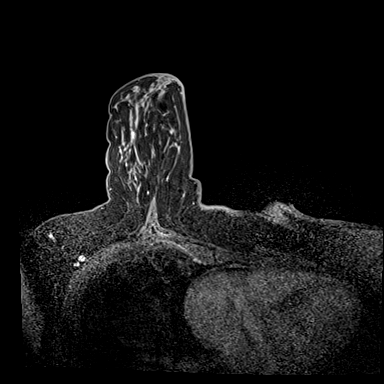
[im 72/144]
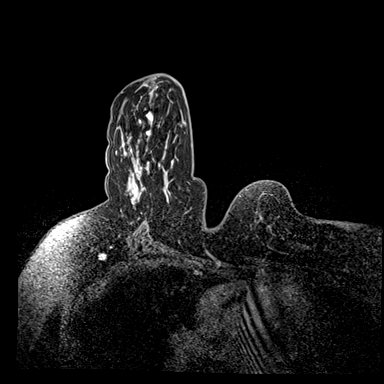
[im 96/144]
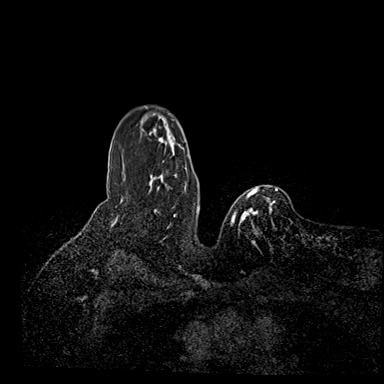
[im 120/144]
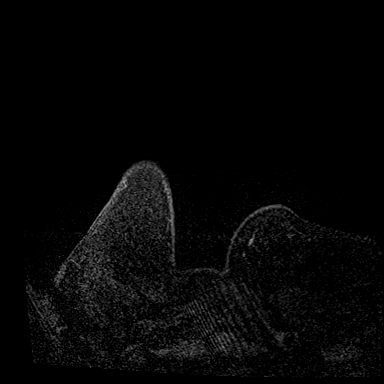
[im 144/144]
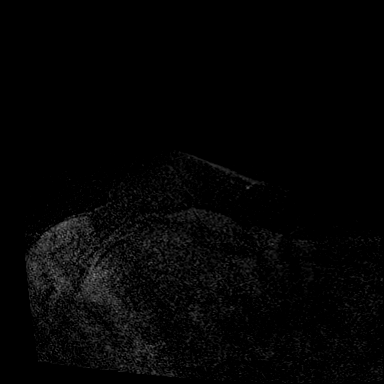

[Series 7: needle confirmation_sub · axial · 1.3mm · 0.73mm/px · z∈[-65,-35]mm · 2 of 144 slices shown]
[im 1/144]
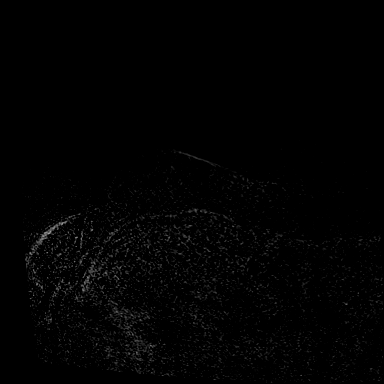
[im 24/144]
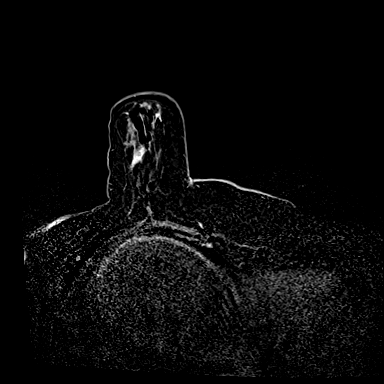

[29 of 48 positions shown; findings below may reference images not displayed]

FINDINGS: I met with the patient, and we discussed the procedure of MRI guided
biopsy, including risks, benefits, and alternatives. Specifically,
we discussed the risks of infection, bleeding, tissue injury, clip
migration, and inadequate sampling. Informed, written consent was
given. The usual time out protocol was performed immediately prior
to the procedure.

Using sterile technique, 1% Lidocaine, MRI guidance, and a 9 gauge
vacuum assisted device, biopsy was performed of linear non mass
enhancement in the central RIGHT breast using a LATERAL approach. At
the conclusion of the procedure, a barbell tissue marker clip was
deployed into the biopsy cavity. Follow-up 2-view mammogram was
performed and dictated separately.
IMPRESSION: MRI guided biopsy of enhancement in the RIGHT breast. No apparent
complications.

ADDENDUM:
Pathology revealed INTRADUCTAL PAPILLOMA of the RIGHT breast,
central, (barbell clip). This was found to be concordant by Dr.
AUGUSTO PIRES, with surgical consultation for excision
recommended, per [HOSPITAL] Breast Working Group protocol.

Pathology results were discussed with the patient by telephone. The
patient reported doing well after the biopsy with tenderness at the
site. Post biopsy instructions and care were reviewed, and questions
were answered. The patient was encouraged to call The [REDACTED] for any additional concerns. My direct phone
number was provided.

Request for surgical consultation with Dr. AUGUSTO PIRES at [REDACTED] on [DATE] via [REDACTED] message. The patient was
contacted by [REDACTED] on [DATE] and [DATE] to
arrange a consultation, a voicemail was left both times of contact.

Pathology results reported by AUGUSTO PIRES, RN on [DATE].

ADDENDUM:
Additionally, patient has a probably benign 0.5 centimeter mass in
the 6 o'clock location of the LEFT breast 5 centimeters from the
nipple. If excision is planned, consider ultrasound-guided core
biopsy of LEFT breast mass prior to surgery.

*** End of Addendum ***
Addendum:
FINDINGS: I met with the patient, and we discussed the procedure of MRI guided
biopsy, including risks, benefits, and alternatives. Specifically,
we discussed the risks of infection, bleeding, tissue injury, clip
migration, and inadequate sampling. Informed, written consent was
given. The usual time out protocol was performed immediately prior
to the procedure.

Using sterile technique, 1% Lidocaine, MRI guidance, and a 9 gauge
vacuum assisted device, biopsy was performed of linear non mass
enhancement in the central RIGHT breast using a LATERAL approach. At
the conclusion of the procedure, a barbell tissue marker clip was
deployed into the biopsy cavity. Follow-up 2-view mammogram was
performed and dictated separately.
IMPRESSION: MRI guided biopsy of enhancement in the RIGHT breast. No apparent
complications.

ADDENDUM:
Pathology revealed INTRADUCTAL PAPILLOMA of the RIGHT breast,
central, (barbell clip). This was found to be concordant by Dr.
AUGUSTO PIRES, with surgical consultation for excision
recommended, per [HOSPITAL] Breast Working Group protocol.

Pathology results were discussed with the patient by telephone. The
patient reported doing well after the biopsy with tenderness at the
site. Post biopsy instructions and care were reviewed, and questions
were answered. The patient was encouraged to call The [REDACTED] for any additional concerns. My direct phone
number was provided.

Request for surgical consultation with Dr. AUGUSTO PIRES at [REDACTED] on [DATE] via [REDACTED] message. The patient was
contacted by [REDACTED] on [DATE] and [DATE] to
arrange a consultation, a voicemail was left both times of contact.

Pathology results reported by AUGUSTO PIRES, RN on [DATE].

*** End of Addendum ***
FINDINGS: I met with the patient, and we discussed the procedure of MRI guided
biopsy, including risks, benefits, and alternatives. Specifically,
we discussed the risks of infection, bleeding, tissue injury, clip
migration, and inadequate sampling. Informed, written consent was
given. The usual time out protocol was performed immediately prior
to the procedure.

Using sterile technique, 1% Lidocaine, MRI guidance, and a 9 gauge
vacuum assisted device, biopsy was performed of linear non mass
enhancement in the central RIGHT breast using a LATERAL approach. At
the conclusion of the procedure, a barbell tissue marker clip was
deployed into the biopsy cavity. Follow-up 2-view mammogram was
performed and dictated separately.
IMPRESSION: MRI guided biopsy of enhancement in the RIGHT breast. No apparent
complications.

## 2022-03-02 MED ORDER — GADOBENATE DIMEGLUMINE 529 MG/ML IV SOLN
7.0000 mL | Freq: Once | INTRAVENOUS | Status: AC | PRN
  Administered 2022-03-02: 7 mL via INTRAVENOUS

## 2022-04-17 ENCOUNTER — Other Ambulatory Visit: Payer: Self-pay | Admitting: General Surgery

## 2022-04-17 DIAGNOSIS — D241 Benign neoplasm of right breast: Secondary | ICD-10-CM

## 2022-04-17 DIAGNOSIS — N6452 Nipple discharge: Secondary | ICD-10-CM

## 2022-07-31 ENCOUNTER — Other Ambulatory Visit: Payer: Self-pay | Admitting: Nurse Practitioner

## 2022-07-31 ENCOUNTER — Ambulatory Visit
Admission: RE | Admit: 2022-07-31 | Discharge: 2022-07-31 | Disposition: A | Discharge status: Home / Self Care | Payer: 59 | Source: Ambulatory Visit | Attending: Nurse Practitioner | Admitting: Nurse Practitioner

## 2022-07-31 DIAGNOSIS — N6320 Unspecified lump in the left breast, unspecified quadrant: Secondary | ICD-10-CM

## 2022-07-31 DIAGNOSIS — D241 Benign neoplasm of right breast: Secondary | ICD-10-CM

## 2023-01-16 ENCOUNTER — Other Ambulatory Visit: Payer: Self-pay | Admitting: Nurse Practitioner

## 2023-01-16 ENCOUNTER — Other Ambulatory Visit (HOSPITAL_COMMUNITY)
Admission: RE | Admit: 2023-01-16 | Discharge: 2023-01-16 | Disposition: A | Discharge status: Home / Self Care | Payer: 59 | Source: Ambulatory Visit | Attending: Nurse Practitioner | Admitting: Nurse Practitioner

## 2023-01-16 DIAGNOSIS — Z124 Encounter for screening for malignant neoplasm of cervix: Secondary | ICD-10-CM | POA: Diagnosis not present

## 2023-01-26 LAB — CYTOLOGY - PAP
Comment: NEGATIVE
Diagnosis: NEGATIVE
High risk HPV: NEGATIVE

## 2023-02-08 ENCOUNTER — Ambulatory Visit
Admission: RE | Admit: 2023-02-08 | Discharge: 2023-02-08 | Disposition: A | Discharge status: Home / Self Care | Payer: 59 | Source: Ambulatory Visit | Attending: Nurse Practitioner | Admitting: Nurse Practitioner

## 2023-02-08 DIAGNOSIS — D241 Benign neoplasm of right breast: Secondary | ICD-10-CM

## 2024-01-21 ENCOUNTER — Other Ambulatory Visit: Payer: Self-pay | Admitting: Nurse Practitioner

## 2024-01-21 DIAGNOSIS — N6310 Unspecified lump in the right breast, unspecified quadrant: Secondary | ICD-10-CM

## 2024-01-28 ENCOUNTER — Other Ambulatory Visit: Payer: Self-pay | Admitting: Nurse Practitioner

## 2024-01-28 DIAGNOSIS — D241 Benign neoplasm of right breast: Secondary | ICD-10-CM

## 2024-02-22 ENCOUNTER — Encounter
